# Patient Record
Sex: Female | Born: 1970 | Race: White | Hispanic: No | Marital: Single | Smoking: Former smoker
Health system: Southern US, Community
[De-identification: ages and names within clinical notes are randomized; demographics above are authoritative.]

## PROBLEM LIST (undated history)

## (undated) DIAGNOSIS — K559 Vascular disorder of intestine, unspecified: Secondary | ICD-10-CM

## (undated) DIAGNOSIS — M797 Fibromyalgia: Secondary | ICD-10-CM

## (undated) DIAGNOSIS — K219 Gastro-esophageal reflux disease without esophagitis: Secondary | ICD-10-CM

## (undated) DIAGNOSIS — E559 Vitamin D deficiency, unspecified: Secondary | ICD-10-CM

## (undated) HISTORY — PX: CARPAL TUNNEL RELEASE: SHX101

## (undated) HISTORY — PX: TRIGGER FINGER RELEASE: SHX641

## (undated) HISTORY — PX: ABDOMINAL HYSTERECTOMY: SHX81

## (undated) HISTORY — PX: OTHER SURGICAL HISTORY: SHX169

---

## 2000-01-19 ENCOUNTER — Emergency Department (HOSPITAL_COMMUNITY): Admission: EM | Admit: 2000-01-19 | Discharge: 2000-01-19 | Payer: Self-pay | Admitting: Emergency Medicine

## 2002-08-27 ENCOUNTER — Emergency Department (HOSPITAL_COMMUNITY): Admission: EM | Admit: 2002-08-27 | Discharge: 2002-08-27 | Payer: Self-pay | Admitting: Emergency Medicine

## 2002-08-28 ENCOUNTER — Emergency Department (HOSPITAL_COMMUNITY): Admission: EM | Admit: 2002-08-28 | Discharge: 2002-08-28 | Payer: Self-pay | Admitting: Internal Medicine

## 2004-01-07 ENCOUNTER — Emergency Department (HOSPITAL_COMMUNITY): Admission: EM | Admit: 2004-01-07 | Discharge: 2004-01-07 | Payer: Self-pay | Admitting: Emergency Medicine

## 2004-06-26 ENCOUNTER — Emergency Department: Payer: Self-pay | Admitting: Emergency Medicine

## 2004-09-25 ENCOUNTER — Emergency Department: Payer: Self-pay | Admitting: General Practice

## 2004-11-06 ENCOUNTER — Emergency Department: Payer: Self-pay | Admitting: Internal Medicine

## 2004-11-14 ENCOUNTER — Emergency Department: Payer: Self-pay | Admitting: General Practice

## 2004-11-16 ENCOUNTER — Emergency Department: Payer: Self-pay | Admitting: Emergency Medicine

## 2004-12-10 ENCOUNTER — Emergency Department: Payer: Self-pay | Admitting: Emergency Medicine

## 2004-12-12 ENCOUNTER — Emergency Department: Payer: Self-pay | Admitting: Emergency Medicine

## 2005-01-06 ENCOUNTER — Emergency Department: Payer: Self-pay | Admitting: Emergency Medicine

## 2005-02-05 ENCOUNTER — Emergency Department: Payer: Self-pay | Admitting: Internal Medicine

## 2005-02-12 ENCOUNTER — Ambulatory Visit: Payer: Self-pay | Admitting: Rheumatology

## 2005-02-22 ENCOUNTER — Emergency Department: Payer: Self-pay | Admitting: Emergency Medicine

## 2009-07-30 ENCOUNTER — Emergency Department: Payer: Self-pay | Admitting: Emergency Medicine

## 2009-07-31 ENCOUNTER — Emergency Department: Payer: Self-pay | Admitting: Unknown Physician Specialty

## 2009-10-13 ENCOUNTER — Emergency Department: Payer: Self-pay | Admitting: Emergency Medicine

## 2009-10-27 ENCOUNTER — Emergency Department: Payer: Self-pay | Admitting: Emergency Medicine

## 2009-11-06 ENCOUNTER — Emergency Department: Payer: Self-pay | Admitting: Emergency Medicine

## 2009-12-31 ENCOUNTER — Emergency Department: Payer: Self-pay | Admitting: Internal Medicine

## 2010-04-09 ENCOUNTER — Emergency Department: Payer: Self-pay | Admitting: Internal Medicine

## 2010-04-28 ENCOUNTER — Emergency Department: Payer: Self-pay | Admitting: Unknown Physician Specialty

## 2010-07-03 ENCOUNTER — Emergency Department: Payer: Self-pay | Admitting: Emergency Medicine

## 2010-09-05 ENCOUNTER — Emergency Department: Payer: Self-pay | Admitting: Emergency Medicine

## 2011-01-27 ENCOUNTER — Emergency Department: Payer: Self-pay | Admitting: Emergency Medicine

## 2011-02-28 ENCOUNTER — Emergency Department: Payer: Self-pay | Admitting: Emergency Medicine

## 2011-03-01 ENCOUNTER — Emergency Department: Payer: Self-pay | Admitting: Unknown Physician Specialty

## 2011-06-26 ENCOUNTER — Emergency Department: Payer: Self-pay | Admitting: Emergency Medicine

## 2011-12-01 ENCOUNTER — Emergency Department: Payer: Self-pay | Admitting: Internal Medicine

## 2011-12-08 ENCOUNTER — Emergency Department: Payer: Self-pay | Admitting: Emergency Medicine

## 2011-12-09 ENCOUNTER — Emergency Department: Payer: Self-pay | Admitting: Unknown Physician Specialty

## 2012-02-19 ENCOUNTER — Emergency Department: Payer: Self-pay | Admitting: Emergency Medicine

## 2012-02-19 LAB — COMPREHENSIVE METABOLIC PANEL
Albumin: 3.6 g/dL (ref 3.4–5.0)
Alkaline Phosphatase: 183 U/L — ABNORMAL HIGH (ref 50–136)
Bilirubin,Total: 0.3 mg/dL (ref 0.2–1.0)
Co2: 27 mmol/L (ref 21–32)
Creatinine: 0.66 mg/dL (ref 0.60–1.30)
EGFR (African American): >60
Glucose: 182 mg/dL — ABNORMAL HIGH (ref 65–99)
SGOT(AST): 192 U/L — ABNORMAL HIGH (ref 15–37)
SGPT (ALT): 258 U/L — ABNORMAL HIGH
Sodium: 140 mmol/L (ref 136–145)
Total Protein: 7.2 g/dL (ref 6.4–8.2)

## 2012-02-19 LAB — CBC
MCH: 29.7 pg (ref 26.0–34.0)
Platelet: 259 10*3/uL (ref 150–440)
RDW: 13.8 % (ref 11.5–14.5)

## 2012-02-19 LAB — URINALYSIS, COMPLETE
Bacteria: NONE SEEN
Bilirubin,UR: NEGATIVE
Blood: NEGATIVE
Glucose,UR: NEGATIVE mg/dL (ref 0–75)
Leukocyte Esterase: NEGATIVE
Nitrite: NEGATIVE
Specific Gravity: 1.023 (ref 1.003–1.030)
Squamous Epithelial: 3

## 2012-02-26 ENCOUNTER — Ambulatory Visit: Payer: Self-pay | Admitting: Unknown Physician Specialty

## 2012-02-28 ENCOUNTER — Ambulatory Visit: Payer: Self-pay | Admitting: Unknown Physician Specialty

## 2012-03-05 ENCOUNTER — Ambulatory Visit: Payer: Self-pay | Admitting: Unknown Physician Specialty

## 2012-04-20 ENCOUNTER — Emergency Department: Payer: Self-pay | Admitting: Emergency Medicine

## 2012-06-01 ENCOUNTER — Emergency Department: Payer: Self-pay | Admitting: Emergency Medicine

## 2012-06-01 LAB — LIPASE, BLOOD: Lipase: 102 U/L (ref 73–393)

## 2012-06-01 LAB — CBC
HCT: 40.8 % (ref 35.0–47.0)
HGB: 13.9 g/dL (ref 12.0–16.0)
MCHC: 34.2 g/dL (ref 32.0–36.0)
MCV: 88 fL (ref 80–100)
RDW: 13.8 % (ref 11.5–14.5)

## 2012-06-01 LAB — URINALYSIS, COMPLETE
Bacteria: NONE SEEN
Glucose,UR: NEGATIVE mg/dL (ref 0–75)
Leukocyte Esterase: NEGATIVE
Nitrite: NEGATIVE
Protein: NEGATIVE
RBC,UR: <1 /HPF (ref 0–5)
WBC UR: <1 /HPF (ref 0–5)

## 2012-06-01 LAB — COMPREHENSIVE METABOLIC PANEL
Alkaline Phosphatase: 166 U/L — ABNORMAL HIGH (ref 50–136)
Bilirubin,Total: 0.2 mg/dL (ref 0.2–1.0)
Calcium, Total: 9.2 mg/dL (ref 8.5–10.1)
Chloride: 106 mmol/L (ref 98–107)
Co2: 28 mmol/L (ref 21–32)
EGFR (African American): >60
EGFR (Non-African Amer.): >60
SGPT (ALT): 47 U/L (ref 12–78)

## 2012-06-01 LAB — PREGNANCY, URINE: Pregnancy Test, Urine: NEGATIVE m[IU]/mL

## 2012-06-23 ENCOUNTER — Emergency Department: Payer: Self-pay | Admitting: Internal Medicine

## 2012-07-12 ENCOUNTER — Emergency Department: Payer: Self-pay | Admitting: Emergency Medicine

## 2012-07-12 LAB — URINALYSIS, COMPLETE
Bilirubin,UR: NEGATIVE
Blood: NEGATIVE
Glucose,UR: NEGATIVE mg/dL
Ketone: NEGATIVE
Leukocyte Esterase: NEGATIVE
Nitrite: NEGATIVE
Ph: 8
Protein: NEGATIVE
RBC,UR: 2 /HPF
Specific Gravity: 1.002
Squamous Epithelial: 2
WBC UR: 3 /HPF

## 2012-08-17 ENCOUNTER — Emergency Department: Payer: Self-pay | Admitting: Emergency Medicine

## 2012-10-06 ENCOUNTER — Emergency Department: Payer: Self-pay | Admitting: Emergency Medicine

## 2012-11-01 ENCOUNTER — Emergency Department: Payer: Self-pay | Admitting: Internal Medicine

## 2012-11-01 LAB — COMPREHENSIVE METABOLIC PANEL
Alkaline Phosphatase: 126 U/L (ref 50–136)
Anion Gap: 5 — ABNORMAL LOW (ref 7–16)
BUN: 9 mg/dL (ref 7–18)
EGFR (African American): >60
EGFR (Non-African Amer.): >60
Glucose: 110 mg/dL — ABNORMAL HIGH (ref 65–99)
Total Protein: 7.9 g/dL (ref 6.4–8.2)

## 2012-11-01 LAB — CBC
Platelet: 302 10*3/uL (ref 150–440)
RDW: 13.8 % (ref 11.5–14.5)
WBC: 17.9 10*3/uL — ABNORMAL HIGH (ref 3.6–11.0)

## 2012-11-01 LAB — URINALYSIS, COMPLETE
Bacteria: NONE SEEN
Blood: NEGATIVE
Glucose,UR: NEGATIVE mg/dL (ref 0–75)
Protein: NEGATIVE
RBC,UR: <1 /HPF (ref 0–5)
Squamous Epithelial: 1
WBC UR: 1 /HPF (ref 0–5)

## 2012-12-05 ENCOUNTER — Emergency Department: Payer: Self-pay | Admitting: Unknown Physician Specialty

## 2012-12-05 LAB — CBC
HGB: 12.8 g/dL (ref 12.0–16.0)
MCH: 30.4 pg (ref 26.0–34.0)
MCV: 90 fL (ref 80–100)
RDW: 13.6 % (ref 11.5–14.5)
WBC: 8.2 10*3/uL (ref 3.6–11.0)

## 2012-12-05 LAB — BASIC METABOLIC PANEL
Chloride: 104 mmol/L (ref 98–107)
Co2: 28 mmol/L (ref 21–32)
Creatinine: 0.69 mg/dL (ref 0.60–1.30)
EGFR (African American): >60
Osmolality: 271 (ref 275–301)
Potassium: 3.7 mmol/L (ref 3.5–5.1)

## 2012-12-05 LAB — HEPATIC FUNCTION PANEL A (ARMC)
Albumin: 3.9 g/dL (ref 3.4–5.0)
Alkaline Phosphatase: 126 U/L (ref 50–136)
Bilirubin, Direct: <0.1 mg/dL (ref 0.00–0.20)
SGPT (ALT): 44 U/L (ref 12–78)

## 2012-12-05 LAB — TROPONIN I: Troponin-I: <0.02 ng/mL

## 2012-12-05 LAB — LIPASE, BLOOD: Lipase: 157 U/L (ref 73–393)

## 2012-12-25 ENCOUNTER — Emergency Department: Payer: Self-pay | Admitting: Emergency Medicine

## 2013-01-01 ENCOUNTER — Emergency Department: Payer: Self-pay | Admitting: Internal Medicine

## 2013-02-12 ENCOUNTER — Emergency Department: Payer: Self-pay | Admitting: Emergency Medicine

## 2013-02-22 ENCOUNTER — Inpatient Hospital Stay: Payer: Self-pay | Admitting: Internal Medicine

## 2013-02-22 DIAGNOSIS — K559 Vascular disorder of intestine, unspecified: Secondary | ICD-10-CM | POA: Insufficient documentation

## 2013-02-22 LAB — COMPREHENSIVE METABOLIC PANEL
Alkaline Phosphatase: 125 U/L (ref 50–136)
Anion Gap: 5 — ABNORMAL LOW (ref 7–16)
Calcium, Total: 9.3 mg/dL (ref 8.5–10.1)
Chloride: 102 mmol/L (ref 98–107)
Co2: 29 mmol/L (ref 21–32)
Osmolality: 272 (ref 275–301)
Potassium: 4 mmol/L (ref 3.5–5.1)
SGOT(AST): 23 U/L (ref 15–37)
Total Protein: 8 g/dL (ref 6.4–8.2)

## 2013-02-22 LAB — URINALYSIS, COMPLETE
Bacteria: NONE SEEN
Nitrite: NEGATIVE
Ph: 7 (ref 4.5–8.0)
RBC,UR: <1 /HPF (ref 0–5)
Squamous Epithelial: <1
WBC UR: <1 /HPF (ref 0–5)

## 2013-02-22 LAB — CBC
MCH: 31.1 pg (ref 26.0–34.0)
MCV: 89 fL (ref 80–100)
RDW: 14 % (ref 11.5–14.5)
WBC: 11.7 10*3/uL — ABNORMAL HIGH (ref 3.6–11.0)

## 2013-02-23 LAB — COMPREHENSIVE METABOLIC PANEL
Albumin: 3.3 g/dL — ABNORMAL LOW (ref 3.4–5.0)
Calcium, Total: 8.4 mg/dL — ABNORMAL LOW (ref 8.5–10.1)
EGFR (African American): >60
EGFR (Non-African Amer.): >60
Sodium: 137 mmol/L (ref 136–145)
Total Protein: 6.2 g/dL — ABNORMAL LOW (ref 6.4–8.2)

## 2013-02-23 LAB — CBC WITH DIFFERENTIAL/PLATELET
Eosinophil %: 1.2 %
Lymphocyte %: 19.5 %
Neutrophil %: 72.2 %
RDW: 14 % (ref 11.5–14.5)
WBC: 15.3 10*3/uL — ABNORMAL HIGH (ref 3.6–11.0)

## 2013-02-23 LAB — CLOSTRIDIUM DIFFICILE BY PCR

## 2013-02-24 LAB — BASIC METABOLIC PANEL
Anion Gap: 5 — ABNORMAL LOW (ref 7–16)
Calcium, Total: 8.6 mg/dL (ref 8.5–10.1)
Co2: 29 mmol/L (ref 21–32)
Creatinine: 0.45 mg/dL — ABNORMAL LOW (ref 0.60–1.30)
EGFR (Non-African Amer.): >60
Sodium: 135 mmol/L — ABNORMAL LOW (ref 136–145)

## 2013-02-24 LAB — CBC WITH DIFFERENTIAL/PLATELET
Basophil #: 0 10*3/uL (ref 0.0–0.1)
Eosinophil #: 0.2 10*3/uL (ref 0.0–0.7)
Eosinophil %: 1.3 %
HCT: 34.4 % — ABNORMAL LOW (ref 35.0–47.0)
HGB: 11.6 g/dL — ABNORMAL LOW (ref 12.0–16.0)
Lymphocyte #: 2.6 10*3/uL (ref 1.0–3.6)
Lymphocytes: 31 %
Monocytes: 3 %
Neutrophil #: 10.3 10*3/uL — ABNORMAL HIGH (ref 1.4–6.5)
Neutrophil %: 73.3 %
Platelet: 251 10*3/uL (ref 150–440)
RBC: 3.85 10*6/uL (ref 3.80–5.20)
RDW: 13.7 % (ref 11.5–14.5)
Segmented Neutrophils: 63 %

## 2013-02-24 LAB — SEDIMENTATION RATE: Erythrocyte Sed Rate: 20 mm/hr (ref 0–20)

## 2013-02-24 LAB — STOOL CULTURE

## 2013-02-24 LAB — CLOSTRIDIUM DIFFICILE BY PCR

## 2013-02-25 LAB — CBC WITH DIFFERENTIAL/PLATELET
Basophil #: 0 10*3/uL (ref 0.0–0.1)
Basophil %: 0.1 %
Eosinophil #: 0.1 10*3/uL (ref 0.0–0.7)
HGB: 11.1 g/dL — ABNORMAL LOW (ref 12.0–16.0)
Lymphocyte #: 3 10*3/uL (ref 1.0–3.6)
Lymphocyte %: 22.8 %
MCV: 89 fL (ref 80–100)
Monocyte #: 0.7 x10 3/mm (ref 0.2–0.9)
Monocyte %: 5.4 %
Neutrophil #: 9.3 10*3/uL — ABNORMAL HIGH (ref 1.4–6.5)

## 2013-02-26 LAB — CBC WITH DIFFERENTIAL/PLATELET
Basophil #: 0 10*3/uL (ref 0.0–0.1)
Lymphocyte #: 2.7 10*3/uL (ref 1.0–3.6)
Lymphocyte %: 31.6 %
MCHC: 35 g/dL (ref 32.0–36.0)
Monocyte #: 0.5 x10 3/mm (ref 0.2–0.9)
Monocyte %: 5.6 %
Neutrophil #: 5.1 10*3/uL (ref 1.4–6.5)
Neutrophil %: 60.2 %
Platelet: 297 10*3/uL (ref 150–440)
RBC: 3.53 10*6/uL — ABNORMAL LOW (ref 3.80–5.20)
RDW: 13.4 % (ref 11.5–14.5)

## 2013-02-28 LAB — CULTURE, BLOOD (SINGLE)

## 2013-03-02 LAB — PATHOLOGY REPORT

## 2013-05-11 ENCOUNTER — Emergency Department: Payer: Self-pay | Admitting: Emergency Medicine

## 2013-05-11 LAB — COMPREHENSIVE METABOLIC PANEL
Alkaline Phosphatase: 126 U/L (ref 50–136)
BUN: 7 mg/dL (ref 7–18)
Calcium, Total: 9.5 mg/dL (ref 8.5–10.1)
Co2: 29 mmol/L (ref 21–32)
Creatinine: 0.71 mg/dL (ref 0.60–1.30)
SGOT(AST): 53 U/L — ABNORMAL HIGH (ref 15–37)
SGPT (ALT): 58 U/L (ref 12–78)
Total Protein: 7.9 g/dL (ref 6.4–8.2)

## 2013-05-11 LAB — CBC
HCT: 39 % (ref 35.0–47.0)
HGB: 13.3 g/dL (ref 12.0–16.0)
MCH: 30.9 pg (ref 26.0–34.0)
RBC: 4.31 10*6/uL (ref 3.80–5.20)
WBC: 7.8 10*3/uL (ref 3.6–11.0)

## 2013-05-11 LAB — URINALYSIS, COMPLETE
Bilirubin,UR: NEGATIVE
Glucose,UR: NEGATIVE mg/dL (ref 0–75)
Nitrite: NEGATIVE
Protein: NEGATIVE

## 2013-05-11 LAB — LIPASE, BLOOD: Lipase: 92 U/L (ref 73–393)

## 2013-06-01 DIAGNOSIS — K219 Gastro-esophageal reflux disease without esophagitis: Secondary | ICD-10-CM | POA: Insufficient documentation

## 2013-06-01 DIAGNOSIS — Z87891 Personal history of nicotine dependence: Secondary | ICD-10-CM | POA: Insufficient documentation

## 2013-06-15 ENCOUNTER — Ambulatory Visit: Payer: Self-pay | Admitting: Oncology

## 2013-06-24 ENCOUNTER — Ambulatory Visit: Payer: Self-pay | Admitting: Oncology

## 2013-07-14 ENCOUNTER — Emergency Department: Payer: Self-pay | Admitting: Emergency Medicine

## 2013-08-11 DIAGNOSIS — G4700 Insomnia, unspecified: Secondary | ICD-10-CM | POA: Insufficient documentation

## 2013-08-24 ENCOUNTER — Emergency Department: Payer: Self-pay | Admitting: Emergency Medicine

## 2013-09-03 DIAGNOSIS — E559 Vitamin D deficiency, unspecified: Secondary | ICD-10-CM | POA: Insufficient documentation

## 2015-01-14 NOTE — Consult Note (Signed)
Pt CC is abd pain and colitis.  She can go home today on prednisone 20mg  bid and follow up in office with me on June 16th or June 17th.   Also 4 days of cipro and flagyl is recommended.  Eat a low residue diet. with yogurt 2-3 times a day,.  Electronic Signatures: Scot JunElliott, Juanya Villavicencio T (MD)  (Signed on 08-Jun-14 09:51)  Authored  Last Updated: 08-Jun-14 09:51 by Scot JunElliott, Keishawna Carranza T (MD)

## 2015-01-14 NOTE — Consult Note (Signed)
CC:  rectal bleed and abd pain.  Plan colonoscopy tomorrow afternoon after Golytely prep.  Flex to 25cm did not show any abnormality  Electronic Signatures: Scot JunElliott, Nyal Schachter T (MD)  (Signed on 04-Jun-14 15:56)  Authored  Last Updated: 04-Jun-14 15:56 by Scot JunElliott, Smitty Ackerley T (MD)

## 2015-01-14 NOTE — Consult Note (Signed)
CC: colitis of descending/sigmoid colon.  CT angiogram showed normal wide open vessels.  Will start iv solumedrol and switch to oral prednisone.  Keep on full liquid diet for now.  May go home in 2-3 days,  Work up for hypercoaguable state to be done.  Await bx from colonoscopy yesterday.  Pt exam shows no peritoneal signs, mild tenderness left abd.   Electronic Signatures: Scot JunElliott, Deontrae Drinkard T (MD)  (Signed on 06-Jun-14 16:18)  Authored  Last Updated: 06-Jun-14 16:18 by Scot JunElliott, Emylia Latella T (MD)

## 2015-01-14 NOTE — Consult Note (Signed)
CC: rectal bleed and abd pain.  Her pain and area of involvement on CT suggest ischemic colitis.  Plan to do flex sig tomorrow with sedation to get a look at the inflammed area for a better chance at  diagnosis.  Electronic Signatures: Scot JunElliott, Austyn Perriello T (MD)  (Signed on 03-Jun-14 18:36)  Authored  Last Updated: 03-Jun-14 18:36 by Scot JunElliott, Abdulrahim Siddiqi T (MD)

## 2015-01-14 NOTE — Consult Note (Signed)
Chief Complaint:  Subjective/Chief Complaint Colitis with bloody diarrhea and generalized abdominal pain with rebound tenderness. Negative stool studies. Flex sig  without prep for tomorrow am. Patient states diarrhea is about 10 times today brown with bloody discharge. Less frequent, but pain is still the same. With Dilaudid 1-98m IV q 4 hr prn pain is 5/10 and after  3 hrs it returns to 10/10. Some mild nasuea/no vomiting. Ice chips today. Hurts in abd to walk.   VITAL SIGNS/ANCILLARY NOTES: **Vital Signs.:   03-Jun-14 14:57  Vital Signs Type Routine  Temperature Temperature (F) 99  Celsius 37.2  Temperature Source oral  Pulse Pulse 100  Respirations Respirations 18  Systolic BP Systolic BP 1209 Diastolic BP (mmHg) Diastolic BP (mmHg) 86  Mean BP 99  Pulse Ox % Pulse Ox % 99  Pulse Ox Activity Level  At rest  Oxygen Delivery Room Air/ 21 %   Brief Assessment:  GEN well developed, no acute distress, thin   Cardiac Regular  no murmur   Respiratory normal resp effort  clear BS   Gastrointestinal details normal Soft  Nondistended   Gastrointestinal details abnormal Tender....  Bowel sounds hyperactive   Tenderness diffuse bilat   lower tenderness with rebound tenderness.,   EXTR negative cyanosis/clubbing, negative edema   Lab Results:  Hepatic:  02-Jun-14 04:12   Bilirubin, Total 0.5  Alkaline Phosphatase 90  SGPT (ALT) 23  SGOT (AST) 19  Total Protein, Serum  6.2  Albumin, Serum  3.3  Routine Chem:  02-Jun-14 04:12   Glucose, Serum  107  BUN  4  Creatinine (comp)  0.59  Sodium, Serum 137  Potassium, Serum 4.0  Chloride, Serum 103  Calcium (Total), Serum  8.4  Osmolality (calc) 271  eGFR (African American) >60  eGFR (Non-African American) >60 (eGFR values <672mmin/1.73 m2 may be an indication of chronic kidney disease (CKD). Calculated eGFR is useful in patients with stable renal function. The eGFR calculation will not be reliable in acutely ill  patients when serum creatinine is changing rapidly. It is not useful in  patients on dialysis. The eGFR calculation may not be applicable to patients at the low and high extremes of body sizes, pregnant women, and vegetarians.)  Anion Gap  4  Routine Hem:  02-Jun-14 04:12   WBC (CBC)  15.3  RBC (CBC) 3.95  Hemoglobin (CBC) 12.2  Hematocrit (CBC) 35.1  Platelet Count (CBC) 260  MCV 89  MCH 30.8  MCHC 34.7  RDW 14.0  Neutrophil % 72.2  Lymphocyte % 19.5  Monocyte % 6.9  Eosinophil % 1.2  Basophil % 0.2  Neutrophil #  11.0  Lymphocyte # 3.0  Monocyte #  1.1  Eosinophil # 0.2  Basophil # 0.0 (Result(s) reported on 23 Feb 2013 at 05:03AM.)   Radiology Results: CT:    01-Jun-14 18:44, CT Abdomen and Pelvis With Contrast  CT Abdomen and Pelvis With Contrast   REASON FOR EXAM:    (1) abd pain bleeding; (2) pel pain  COMMENTS:       PROCEDURE: CT  - CT ABDOMEN / PELVIS  W  - Feb 22 2013  6:44PM     RESULT:  Axial CT scanning was performed through the abdomen and pelvis   with reconstructions at 3 mm intervals and slice thicknesses. Review of   multiplanar reconstructed images was performed separately on the VIA   monitor. Comparison is made to study of November 01, 2012.    The liver  exhibits no focal mass nor ductal dilation. The gallbladder,   pancreas, spleen, partially distended stomach, adrenal glands, and   kidneys are normal in appearance. The caliber of the abdominal aorta is   normal. There is no periaortic nor pericaval lymphadenopathy.  The partially contrast-filled loops of small bowel are normal in   appearance. The ascending and transverse portions of the colon are   reasonably normal in appearance. Beginning at approximately the splenic   flexure there is no contrast within the colon, and there is thickening of   the wall of the descending colon and rectosigmoid colon in a fashion   consistent with colitis. There is no free extraluminal gas nor   significant  free fluid demonstrated. A normal calibered gas-filled   appendix is demonstrated. There is no inguinal nor umbilical hernia.    The urinary bladder is partially distended and grossly normal. The uterus   is surgically absent. There are no adnexal masses. The lung bases are   clear. The lumbar vertebral bodies are preserved in height.    IMPRESSION:    1. There is bowelwall thickening of the descending colon and     rectosigmoid consistent with colitis. No significant diverticulosis is   demonstrated. The appearance of the bowel dramatically changed since   February 2014. There is no evidence of an abscess nor freeair. A trace   of free fluid in the pelvis is suspected.  2. There is no acute urinary tract abnormality nor acute hepatobiliary   abnormality.  3. The uterus is surgically absent. No adnexal masses are demonstrated.    A preliminary report was sent to the emergency department at the   conclusion of the study.    Dictation Site: 1      The question of the possibility of the findings in the left colon being     secondary to vascular insufficiency has been raised. While this cannot be   excluded based on the CT findings today, the celiac trunk, the superior   mesenteric trunk, and the inferior mesenteric artery trunk appear normal.   No significant calcified or soft plaque is demonstrated in the abdominal   aorta.        Verified By: DAVID A. Martinique, M.D., MD   Assessment/Plan:  Assessment/Plan:  Assessment Acute bloody diarrhea with generalized abd pain and rebound tenderness. She is now on Meropenem since there is a Flagyl shortage.  Slightly less frequent diarrhea:  Patient reports at least 10 movements some with brown stool, others just blood since this am. Positive nocturnal diarrhea. Etiology r/o NSAID induced, bacterial-neg cultures so far, emerging IBD.   Plan Flat/upright abd XR in am  Surgical consult  cbc w diff, esr, crp and met b now. Repeat stool for C  diff CBC in am.  Flex sig without prep for am  Case d/w Dr. Vira Agar in collaboration of care.   Electronic Signatures: Gershon Mussel (NP)  (Signed 03-Jun-14 15:50)  Authored: Chief Complaint, VITAL SIGNS/ANCILLARY NOTES, Brief Assessment, Lab Results, Radiology Results, Assessment/Plan   Last Updated: 03-Jun-14 15:50 by Gershon Mussel (NP)

## 2015-01-14 NOTE — Consult Note (Signed)
CC: abd pain and rectal bleeding.  Pt had colonoscopy showing inflammation and scattered ulcerations of descending colon only.  Unusual distribution and raises the question of ischemic colitis and possible Crohn;s disease, possible infection.  Bx done, will get hematology consult for evaluation of possible hypercoag state, get Ct angiogram tomorrow and if neg consider vascular angiogram.  Although Crohn's can occur anywhere in GI tract the presentation usually more isidious than hers.  Electronic Signatures: Scot JunElliott, Robert T (MD)  (Signed on 05-Jun-14 17:48)  Authored  Last Updated: 05-Jun-14 17:48 by Scot JunElliott, Robert T (MD)

## 2015-01-14 NOTE — Discharge Summary (Signed)
PATIENT NAME:  [name], [name] DATE OF BIRTH:  June 26, 1971  DATE OF ADMISSION:  02/22/2013 DATE OF DISCHARGE:  03/01/2013  ADMISSION DIAGNOSIS:  Bright red blood per rectum.   DISCHARGE DIAGNOSES 1.  Bright red blood per rectum due to acute colitis. It could be ischemic or inflammatory bowel disease. Currently pathology is pending. Her bright red blood per rectum is resolved.  2.  Leukocytosis due to #1. 3.  Gastroesophageal reflux disease.  4.  Acute blood loss anemia due to gastrointestinal blood loss.  5.  Elevated C-reactive protein of unclear etiology.   CONSULTANTS 1.  Dr. Mechele CollinElliott.  2.  Surgery, Dr. Egbert GaribaldiBird.   PERTINENT LABS AND EVALUATIONS: Urinalysis showed nitrites negative, leukocytes negative. Lipase 74. WBC 11.7, hemoglobin 14.4, platelet count 322. BMP: Glucose 115, BUN 10, creatinine 0.64, sodium 136, potassium 4.0, chloride was 102, CO2 is 29, calcium 9.3. LFTs were normal. Blood cultures x 2 no growth. Stool cultures no salmonella, shigella or E. coli  noted. C. diff. was negative. CT of the abdomen and pelvis showed bowel wall thickening of the descending colon and rectosigmoid consistent with colitis. No significant diverticulosis appreciated. Colonoscopy showed ulcerations in the colon. CRP was 91.6. Sed rate was 20. CT angiogram showed widely patent vasculature, mesenteric vessels were widely patent, no evidence of ischemic colitis, evidence of colitis was present. Delayed colonoscopy and flexible sigmoidoscopy showed internal hemorrhoids. Colonoscopy showed mucosal ulcerations.   HOSPITAL COURSE: Please refer to H and P done by the admitting physician. The patient is a 44 year old female who presented with bright red blood per rectum. She was seen in the ED and had a CT scan which showed findings consistent with colitis. The patient was started on IV antibiotics for possible infectious colitis. She was treated with IV meropenem initially. Stool cultures were  negative for C. diff. as well as other bacterial organisms. The patient was seen by GI. She underwent a sigmoidoscopy which showed some internal hemorrhoids. Subsequently, she had a colonoscopy which showed patchy mucosal ulceration. Differential diagnosis included ischemic colitis or inflammatory bowel disease. The patient was started on prednisone. She had a CTA of the abdomen which showed no evidence of occlusion of the vasculature. She also was seen by surgery. They did not feel any surgical intervention that needed to be done. The patient's pathology results from the biopsy of the colon are currently pending but she is tolerating diet and is doing much better and is stable for discharge. She also had leukocytosis which was felt to be due to her colitis, which has since resolved. Currently, she is doing much better and is cleared by GI for discharge.   DISCHARGE MEDICATIONS: Prilosec 40 daily, amitriptyline 10 mg 1 to 2 tabs at bedtime as needed, Flagyl 500 one tabs p.o. q.8 x 4 days, acetaminophen/hydrocodone 5/325 q.4 p.r.n., Cipro 500 one tabs p.o. q.12 x 4 days, prednisone 20 one tab p.o. b.i.d.   DIET: Low fat, low cholesterol.   ACTIVITY: As tolerated.   FOLLOWUP: Follow up with Dr. Mechele CollinElliott on June 16 or 17 as per his direction.   TIME SPENT: 35 minutes spent on the discharge.    ____________________________ Jennifer ScottsShreyang H. Allena KatzPatel, MD shp:cs D: 03/01/2013 12:45:42 ET T: 03/01/2013 15:08:48 ET JOB#: [id number]  cc: Jennifer Harral H. Allena KatzPatel, MD, <Dictator> Charise CarwinSHREYANG H Nilsa Macht MD ELECTRONICALLY SIGNED 03/07/2013 15:21

## 2015-01-14 NOTE — Consult Note (Signed)
PATIENT NAME:  [name], [name] MR#:  [id number] DATE OF BIRTH:  1971/01/12  DATE OF CONSULTATION:  02/24/2013  CONSULTING PHYSICIAN:  Loraine LericheMark A. Egbert GaribaldiBird, MD  REASON FOR CONSULTATION: I was asked to see this patient by the Medical Service for evaluation of abdominal pain and left-sided colitis.   HISTORY: This is a 44 year old white female admitted to the Medical Service on the 1st of June with a 1-day history of significant abdominal pain and gross bright red blood per rectum. She has never had this before. She has a history of gastroesophageal reflux disease. She has had some intermittent chronic right upper quadrant abdominal pain which has been worked up in the past and not found to be significant for biliary disease. The patient has a remote cigarette smoking history. She does not drink, does not do drugs.  She is currently employed, has 1 child, aged 44. Only other previous operations were bilateral oophorectomy and salpingectomy. The patient states that she has had lessening of bloody diarrhea but continued abdominal pain while in the hospital. She has been seen by GI. A flexible sigmoidoscopy is scheduled for tomorrow. Imaging study demonstrated findings consistent with diffuse left-sided and rectosigmoid colitis. The remaining intra-abdominal organs appeared unremarkable. Surgical Services were asked to comment.   ALLERGIES: INCLUDE KEFLEX AND SULFA.   CURRENT MEDICATIONS: Include: 1.  Meropenem.  2.  Protonix. 3.  Acetaminophen.  4.  Norco.  5.  Dilaudid. 6.  Zofran.   PAST MEDICAL HISTORY: Significant for gastroesophageal reflux disease.   PAST SURGICAL HISTORY:  As described above.   SOCIAL HISTORY: As described above.   FAMILY HISTORY: Noncontributory.  REVIEW OF SYSTEMS: The patient denies fevers, jaundice, vomiting.  Significant for abdominal pain and bloody diarrhea as described above.   PHYSICAL EXAMINATION: GENERAL: The patient is alert and oriented, just got out of the  shower.  VITAL SIGNS: Temperature is 99.1, pulse is 100, respiratory rate 18, blood pressure is 127/86.  LUNGS: Clear.  HEART: Regular rate and rhythm.  ABDOMEN: Mildly distended, well-healed scars. No hernia. No mass.  There is no peritonitis  or significant tenderness in the left lower quadrant as well as in the right lower quadrant.  There is a positive Rovsing's sign on the right side to the left side.  EXTREMITIES: Warm and well perfused.  NEUROLOGICAL/PSYCHIATRIC:  Normal.    LABORATORY AND RADIOLOGICAL DATA:  White count is 14,000. Hemoglobin is 11.6.  Sed rate is 20, platelet count 251,000. Liver function tests are normal. Electrolytes are unremarkable.  Review of CT scan is as described above.   IMPRESSION: A 44 year old white female with abdominal pain and colitis. Differential diagnosis includes ischemic versus infectious versus inflammatory bowel disease.   PLAN: I agree with meropenem, planned luminal evaluation tommorow.  At present,  I see no indication for surgical intervention.   Will follow clinical course with you.  ____________________________ Redge GainerMark A. Egbert GaribaldiBird, MD FACS mab:cb D: 02/24/2013 21:23:37 ET T: 02/24/2013 22:04:55 ET JOB#: [id number]  cc: Loraine LericheMark A. Egbert GaribaldiBird, MD, <Dictator> Raynald KempMARK A Drianna Chandran MD ELECTRONICALLY SIGNED 02/24/2013 22:44

## 2015-01-14 NOTE — Consult Note (Signed)
Dr. Bluford Kaufmannh progress note is on the wrong patient named "Yetta BarreJones" please disregard this note. seen by me and NP Fransico SettersKim Mills.  She has  abd tenderness, abnormal CT scan, 1176f descending and recto sigmoid colon which is either ischemia or food poisoning or possible reaction to NSAID.  Infection and possible ischemia are the most likely.  Will follow, on antibiotics. Could have large or small vessel disease.  Plan Colonoscopy With no prep Wednesday.  Electronic Signatures: Scot JunElliott, Robert T (MD)  (Signed on 02-Jun-14 20:11)  Authored  Last Updated: 02-Jun-14 20:11 by Scot JunElliott, Robert T (MD)

## 2015-01-14 NOTE — Consult Note (Signed)
PATIENT NAME:  [name], [name] MR#:  [id number] DATE OF BIRTH:  [birth year]  DATE OF CONSULTATION:  02/23/2013  REFERRING PHYSICIAN:  Adrian Saran, MD CONSULTING PHYSICIAN:  Lynnae Prude, MD / Ranae Plumber. Arvilla Market, ANP (Adult Nurse Practitioner)  REASON FOR CONSULTATION: Rectal bleed with possible colitis.   HISTORY OF PRESENT ILLNESS: This 44 year old patient of Dr. Allena Katz at [facility] has past medical history of perforated peptic ulcer disease at age 28 and hypertension. She has had problems with dental caries and had finished amoxicillin, 10 day course, as well as ibuprofen 800 mg 1 a day for about 2 weeks. The patient reports Thursday morning, 02/19/2013, she awoke at 0500 with vomiting. Stomach did not feel well all day long, but nothing specific. She rested, ate toast, had no further emesis episodes. On Friday she still felt a little off but Saturday she was able to eat a grilled chicken sandwich from [employer]. She later developed discomfort, right upper quadrant, described as a knot that radiated into the right back, and was severe enough that she had trouble sleeping. She did take ibuprofen for this discomfort. She awoke at 0430 with hurting in the lower abdomen, sense that she had to have a bowel movement, and while sitting on the toilet developed nausea, sweatiness and passed a large loose bowel movement. When she went back to bed she immediately had to get up and passed a second movement and this was of more liquid with blood in it. After that she had bloody diarrhea every 15 minutes and continued to have bilateral lower abdominal pain radiating to the back associated with chills.   She presented to the Emergency Room with this history and underwent a CT study of the abdomen and pelvis with contrast that showed bowel wall thickening of the descending colon and rectosigmoid colon consistent with colitis. Stool studies were negative for comprehensive culture, C. difficile PCR, blood  cultures negative and urinalysis unremarkable. Mild leukocytosis.  The patient was given a dose of Zosyn, presents now on Cipro and Flagyl. She states her diarrhea may be a little less, but she still has considerable abdominal pain requiring pain medication.   The patient denies any prior history of abdominal pain or bloody diarrhea. She has had some problems with epigastric discomfort and right upper quadrant and underwent upper endoscopy last year for investigation. A CT study last year showed mild fatty infiltration of the liver, oral contrast retained in the stomach and no evidence of abnormal gallbladder. Abdominal ultrasound and HIDA study were negative. She thinks she had a remote colonoscopy years ago and cannot give details.   PAST MEDICAL HISTORY: 1.  Perforated peptic ulcer, age 24.  2.  Hypertension.  3.  Acid reflux.  4.  Minimal active chronic gastritis, negative H. pylori.  5.  Fatty infiltration of the liver, per CT study.  6.  History of elevation in liver enzymes with negative ANA, negative hepatitis viral panel, normal pro time, platelet count, ferritin, ceruloplasmin, active smooth muscle antibody and mitochondrial antibody.   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 Remote cocaine use. Negative history of IV drug use.   SOCIAL HISTORY: The patient is single, has a 73 year old son, recently unemployed.   FAMILY HISTORY: Maternal grandmother with colon cancer in her 77s. Son with asthma. Paternal grandmother with lung cancer in her 22s. There is positive breast cancer. The patient and mother deny history of colon  polyps in mother.   REVIEW OF SYSTEMS:  Ten systems reviewed and are negative, with the exception as noted in history of present illness for chills, vomiting, diarrhea, chronic intermittent  right upper quadrant discomfort and epigastric discomfort with findings of possible gastroparesis, chronic  active gastritis per EGD, fatty liver on CT study. Positive situational stress with recent job loss. She reports normal weight, diet and appetite. Remaining systems negative.   PHYSICAL EXAMINATION: VITAL SIGNS: 98.2, 101, 18, 125/84, 98% room air.  GENERAL: Well nourished, healthy-appearing, middle-aged Caucasian female INAD.  HEENT: Head is normocephalic. Conjunctivae is pink. Sclerae is anicteric. Oral mucosa is moist and intact.  HEART: Heart tones S1 and S2 without murmur or gallop.  LUNGS:  CTA. Respirations are eupneic.  ABDOMEN: Soft, positive hyperactive bowel sounds, positive tenderness, generalized, left and right lower quadrant. Very tender with palpation and percussion. She has pain that hurts more with cough.  RECTAL: Deferred.  SKIN: Warm and dry without rash or edema.  PSYCH: Affect and mood within normal, cooperative, pleasant.  NEUROLOGICAL: Cranial nerves II through XII grossly intact.  LABORATORY AND DIAGNOSTICS: Admission labs with WBC 11.7, now 15.3. Hemoglobin 14.4, now 12.2. Neutrophil is elevated at 11 today. Blood culture negative x 2. Stool culture negative. C. diff PCR negative. Urinalysis is normal.   Radiology:  CT scan of the abdomen and pelvis showed liver without focal mass or ductal dilatation. Partially contrast loops of small bowel are normal. Ascending transverse portion of the colon are normal. Beginning approximately at the splenic flexure there is no contrast within the colon and there is thickening of the wall of the descending colon and rectosigmoid colon in a fashion consistent with colitis. There is no free extraluminal gas or significant free fluid demonstrated. A normal calibrated gas-filled appendix is demonstrated. No inguinal or umbilical hernia. The uterus is surgically absent. No adnexal masses are demonstrated.   IMPRESSION: The patient presents with acute episode of bloody diarrhea, significant abdominal pain and an abnormal CT study suggesting  left-sided colitis. The patient has exposure to amoxicillin and ibuprofen. C. difficile PCR and comprehensive stool studies are negative. She presents with leukocytosis and is on current antibiotic therapy. Frequency of diarrhea is slightly decreased with this hospitalization, but not yet back to normal. She has considerable abdominal tenderness, left side greater than right, but is bilateral. Etiology to include bacterial diarrhea with chicken dinner, ischemic colitis, NSAID induced colitis or new onset inflammatory bowel disease.   PLAN: This case was discussed with Dr. Mechele CollinElliott in collaboration of care and he does recommend continuing with Cipro 400 mg IV q. 12 hours and increasing the Flagyl to 500 mg IV q. 6 hours.  I will ask radiology to comment on the vascular portion of the contrasted CT study. Continue with hydration, serial labs, pain medication and GI to follow closely with you.   Thank you for the consultation.   These services are provided by Cala BradfordKimberly A. Arvilla MarketMills, RN, MS, APRN, [facility], ANP under collaborative agreement with Scot Junobert T. Elliott, MD  ____________________________ Ranae PlumberKimberly A. Arvilla MarketMills, ANP (Adult Nurse Practitioner) kam:sb D: 02/23/2013 14:27:27 ET T: 02/23/2013 15:04:46 ET JOB#: [id number]  cc: Cala BradfordKimberly A. Arvilla MarketMills, ANP (Adult Nurse Practitioner), <Dictator> Ranae PlumberKimberly A. Suzette BattiestMills RN, MSN, ANP-BC Adult Nurse Practitioner ELECTRONICALLY SIGNED 02/24/2013 8:07

## 2015-01-14 NOTE — Consult Note (Signed)
Pt CC is colitis of descending colon, possible Crohn's, possible small vessel ischemic colitis, hematology work up in progress.  Pt improves each day.  Could change to oral meds, ambulate and home tomorrow and follow up with me as out pt. Hgb 12, TSH nl, blood cult neg.  Await bx.  Can change to oral prednisone at 40mg  a day and stop iv antibioitics.  Give 4-5 days of Cipro and flagyl oral.  Abd exam less tender, no masses, abd flat.  Electronic Signatures: Scot JunElliott, Robert T (MD)  (Signed on 07-Jun-14 11:04)  Authored  Last Updated: 07-Jun-14 11:04 by Scot JunElliott, Robert T (MD)

## 2015-01-14 NOTE — H&P (Signed)
PATIENT NAME:  [name], [name] MR#:  [id number] DATE OF BIRTH:  May 09, 1971  DATE OF ADMISSION:  02/22/2013  PRIMARY CARE PHYSICIAN:  Dr. Denton Lank  CHIEF COMPLAINT: Bright red blood per rectum.  HISTORY OF PRESENT ILLNESS:  A 44 year old female with history of GERD, who presents with the above complaint. Since 4:00 this morning she has had gross blood in her bowel movements, and now all she is doing is passing blood clots through her rectum. This also is  associated with cramping abdominal pain. She says it feels worse than labor pains. Actually, the abdominal pain gets worse with every bowel movement. She does have a remote history of diverticulosis. She had a recent tooth infection and she was taking some ibuprofen, but she said she only took a couple of doses.    REVIEW OF SYSTEMS: CONSTITUTIONAL:  No fever. She has fatigue and weakness.   EYES:  No blurred or double vision, glaucoma, cataracts. EARS, NOSE, THROAT:  No ear pain, hearing loss, seasonal allergies, postnasal drip, sinus pain.  RESPIRATORY:  No cough, wheezing, hemoptysis, dyspnea.  CARDIOVASCULAR:  No chest pain, orthopnea, edema, arrhythmia, dyspnea on exertion.  GASTROINTESTINAL: No nausea or vomiting. Positive abdominal pain. Positive rectal bleeding. Positive GERD.  GENITOURINARY:  No dysuria or hematuria. ENDOCRINE:  No polyuria, polydipsia, thyroid problems.  HEMATOLOGIC/LYMPHATIC:  No anemia or easy bruising. SKIN:  No rash or lesions.  MUSCULOSKELETAL:  No limited activity. No arthritis.  NEUROLOGIC:  No history of CVA, TIA, seizures.  PSYCHIATRIC:  No history of anxiety or depression.   PAST MEDICAL HISTORY:  GERD.   MEDICATIONS:  Prilosec OTC.   FAMILY HISTORY:  Positive for hypertension.   SOCIAL HISTORY:  No tobacco, alcohol or drug use.   SURGICAL HISTORY:   Hysterectomy and oophorectomy.   PHYSICAL EXAMINATION: VITAL SIGNS:  Temperature 98.1, pulse 80, respirations 18, blood pressure 151/97, 100% on  room air.  GENERAL:  The patient is alert and oriented, is in some distress, basically due to her abdominal pain.  HEENT:  Head is atraumatic. Pupils are round. Sclerae anicteric. Mucous membranes are dry.  Oropharynx is clear.  NECK:  Supple. No JVD, carotid bruit, enlarged thyroid.  CARDIOVASCULAR:  Regular rate and rhythm. No murmurs, gallops or rubs. PMI is not displaced.  LUNGS:  Clear to auscultation, without crackles, rales, rhonchi or wheezing. Normal percussion. Symmetrical rise of the chest.  ABDOMEN: Bowel sounds are positive. She has tenderness diffusely, but no rebound or guarding. No hepatosplenomegaly.   MUSCULOSKELETAL: No pathology to digits or nails. Extremities move x 4. No tenderness or effusion.  SKIN:  Without rash or lesions.  BACK:  No CVA or vertebral tenderness.  NEUROLOGIC:  Cranial nerves II through XII are grossly intact. No focal deficits.  EXTREMITIES:  No clubbing, cyanosis, edema.   LABORATORIES:   Urinalysis shows 1+ blood, negative LCE, white blood cells. Total protein 8.0, albumin 4.4, bilirubin 0.2, alk phos 125, AST 23, ALT 35. Sodium 136, potassium 4.0, chloride 102, bicarb 29, BUN 10, creatinine 0.64, glucose 115, calcium is 9.3. CT of the abdomen and pelvis shows nonspecific colitis, sigmoid and left colon.   ASSESSMENT AND PLAN:  A 44 year old female who presents with bright red blood per rectum, found to have colitis of the sigmoid and left colon.   1.  Bright red blood per rectum secondary to colitis, less likely ischemic in nature. Will obtain a GI consultation. I started Cipro and Flagyl. Stool cultures and Clostridium difficile cultures  have been ordered in the Emergency Room. She also has abdominal pain associated with this, so we will treat with pain medications p.r.n.   2.  Colitis, as mentioned above. Will treat with Cipro and Flagyl. Stool cultures and C. diff. GI consultation for possible flex sig.   3.  Elevated glucose. Will repeat a BMP in  the morning, and monitor glucose levels. If it is still elevated, we can consider as an outpatient further work-up.   4.  The patient is a FULL CODE STATUS.    Time spent:  Approximately 40 minutes.    ____________________________ Donell Beers. Benjie Karvonen, MD spm:mr D: 02/22/2013 20:24:26 ET T: 02/22/2013 20:52:23 ET JOB#: [id number]  cc: Elam Ellis P. Benjie Karvonen, MD, <Dictator> Sarah "Baltazar Apo, MD  Donell Beers Shane Melby MD ELECTRONICALLY SIGNED 02/22/2013 22:35

## 2015-01-14 NOTE — Consult Note (Signed)
Brief Consult Note: Diagnosis: Ischemic colitis/Crohns.   Recommend further assessment or treatment.   Discussed with Attending MD.   Comments: Patient with Gi Bleed with colitis- awaiting pathology Discussed with Dr Markham JordanElliot. Possibility of mesenteric ischemia. Awaiting angiogram Thrombophilia panel ordered. Will follow.  Electronic Signatures: [name], [name] (MD)  (Signed 06-Jun-14 15:17)  Authored: Brief Consult Note   Last Updated: 06-Jun-14 15:17 by [name], Namiko Pritts Kennedy (MD)

## 2015-01-14 NOTE — Consult Note (Signed)
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 PD cannulated but deep cannulation not possible. CBD could not be cannulated despite numerous attempts. Elected to stop. Keep NPO except for meds/ice chips. Watch for pancreatitis. Needs interventional radiology to do PTC and drain. If not successful, will need to go to tertiary care for drainage. Dr. Vira Agar to see patient tomorrow.Thanks  Electronic Signatures: Verdie Shire (MD)  (Signed on 02-Jun-14 15:42)  Authored  Last Updated: 02-Jun-14 15:42 by Verdie Shire (MD)

## 2015-01-14 NOTE — Consult Note (Signed)
Brief Consult Note: Diagnosis: colitis, ischemic vs infectious, vs IBD.   Patient was seen by consultant.   Consult note dictated.   Recommend further assessment or treatment.   Comments: At present no surgical indication for bleeding perforation or obstruction, awaiting endoscopy findings will be of benefit to triage further work-up and direct therapy.  Electronic Signatures: Natale LayBird, Kairah Leoni (MD)  (Signed 03-Jun-14 20:38)  Authored: Brief Consult Note   Last Updated: 03-Jun-14 20:38 by Natale LayBird, Yarden Hillis (MD)

## 2015-04-06 ENCOUNTER — Encounter: Payer: Self-pay | Admitting: Emergency Medicine

## 2015-04-06 ENCOUNTER — Emergency Department
Admission: EM | Admit: 2015-04-06 | Discharge: 2015-04-06 | Disposition: A | Discharge status: Home / Self Care | Payer: No Typology Code available for payment source | Attending: Emergency Medicine | Admitting: Emergency Medicine

## 2015-04-06 DIAGNOSIS — R21 Rash and other nonspecific skin eruption: Secondary | ICD-10-CM | POA: Insufficient documentation

## 2015-04-06 HISTORY — DX: Gastro-esophageal reflux disease without esophagitis: K21.9

## 2015-04-06 MED ORDER — METHYLPREDNISOLONE SODIUM SUCC 125 MG IJ SOLR
80.0000 mg | Freq: Once | INTRAMUSCULAR | Status: AC
  Administered 2015-04-06: 80 mg via INTRAMUSCULAR
  Filled 2015-04-06: qty 2

## 2015-04-06 NOTE — ED Notes (Signed)
C/o rash with itching

## 2015-04-06 NOTE — ED Provider Notes (Signed)
[facility] Emergency Department Provider Note  ____________________________________________  Time seen:1300  I have reviewed the triage vital signs and the nursing notes.   HISTORY  Chief Complaint Rash   HPI [name] is a 44 y.o. female is here with complaint of rash for several weeks. 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  None of her coworkers had this rash and no one in her family has this as well. She states she has had this in the past and has gotten better. Recently it started coming back and she began taking a pack of prednisone and that long-term mother. After the 6 days of taking prednisone areas were drying up but more areas were appearing. She states that she has seen her PCP in the past and was told that it was psoriasis. She has not followed up with anyone since. Currently she complains of itching especially when getting hot. She denies any fever or chills. Rash is located on the upper and lower extremities. She denies having any on the trunk.Negative for tick bites   Past Medical History  Diagnosis Date  . GERD (gastroesophageal reflux disease)     There are no active problems to display for this patient.   History reviewed. No pertinent past surgical history.  No current outpatient prescriptions on file.  Allergies Sulfa antibiotics  History reviewed. No pertinent family history.  Social History History  Substance Use Topics  . Smoking status: Never Smoker   . Smokeless tobacco: Not on file  . Alcohol Use: No    Review of Systems Constitutional: No fever/chills Eyes: No visual changes. ENT: No sore throat. Cardiovascular: Denies chest pain. Respiratory: Denies shortness of breath. Gastrointestinal: No abdominal pain.  No nausea, no vomiting.  Genitourinary: Negative for dysuria. Musculoskeletal: Negative for back pain. Skin: Positive for rash. Neurological: Negative for headaches  10-point ROS  otherwise negative.  ____________________________________________   PHYSICAL EXAM:  VITAL SIGNS: ED Triage Vitals  Enc Vitals Group     BP 04/06/15 1209 155/89 mmHg     Pulse Rate 04/06/15 1209 86     Resp 04/06/15 1209 20     Temp 04/06/15 1209 98.5 F (36.9 C)     Temp Source 04/06/15 1209 Oral     SpO2 04/06/15 1209 98 %     Weight 04/06/15 1209 158 lb (71.668 kg)     Height 04/06/15 1209  (1.549 m)     Head Cir --      Peak Flow --      Pain Score 04/06/15 1210 0     Pain Loc --      Pain Edu? --      Excl. in GC? --     Constitutional: Alert and oriented. Well appearing and in no acute distress. Eyes: Conjunctivae are normal. PERRL. EOMI. Head: Atraumatic. Nose: No congestion/rhinnorhea. Neck: No stridor. Cardiovascular: Normal rate, regular rhythm. Grossly normal heart sounds.  Good peripheral circulation. Respiratory: Normal respiratory effort.  No retractions. Lungs CTAB. Gastrointestinal: Soft and nontender. No distention.. Musculoskeletal: No lower extremity tenderness nor edema.  No joint effusions. Neurologic:  Normal speech and language. No gross focal neurologic deficits are appreciated. No gait instability. Skin:  Skin is warm, dry and intact. Erythematous, macular, papular lesions on the lower extremity and forearms. There is no lesions noted on the back or chest and abdomen area. There is no specific pattern. No lesions were noted in the web spaces of the  hands. Some areas appear dry and cracked. There is noted drainage from these areas. Psychiatric: Mood and affect are normal. Speech and behavior are normal.  ____________________________________________   LABS (all labs ordered are listed, but only abnormal results are displayed)  Labs Reviewed - No data to display ____________________________________________  PROCEDURES  Procedure(s) performed: None  Critical Care performed: No  ____________________________________________   INITIAL  IMPRESSION / ASSESSMENT AND PLAN / ED COURSE  Pertinent labs & imaging results that were available during my care of the patient were reviewed by me and considered in my medical decision making (see chart for details).  Patient is to follow-up with [facility]. She was given a injection of Solu-Medrol. She is continue using the creams that she got from her PCP. Her rash is suggestive of eczema. Etiology is undetermined ____________________________________________   FINAL CLINICAL IMPRESSION(S) / ED DIAGNOSES  Final diagnoses:  Rash, skin      Tommi RumpsRhonda L Summers, PA-C 04/06/15 1629  Sharyn CreamerMark Quale, MD 04/11/15 [phone]

## 2015-04-06 NOTE — ED Notes (Signed)
States she developed rash couple of weeks ago. Finished prednisone and then rash is now worse

## 2015-05-16 ENCOUNTER — Encounter: Payer: Self-pay | Admitting: Emergency Medicine

## 2015-05-16 ENCOUNTER — Emergency Department
Admission: EM | Admit: 2015-05-16 | Discharge: 2015-05-16 | Disposition: A | Discharge status: Home / Self Care | Payer: No Typology Code available for payment source | Attending: Emergency Medicine | Admitting: Emergency Medicine

## 2015-05-16 DIAGNOSIS — Z79899 Other long term (current) drug therapy: Secondary | ICD-10-CM | POA: Insufficient documentation

## 2015-05-16 DIAGNOSIS — R21 Rash and other nonspecific skin eruption: Secondary | ICD-10-CM | POA: Diagnosis present

## 2015-05-16 DIAGNOSIS — L259 Unspecified contact dermatitis, unspecified cause: Secondary | ICD-10-CM | POA: Diagnosis not present

## 2015-05-16 HISTORY — DX: Vascular disorder of intestine, unspecified: K55.9

## 2015-05-16 MED ORDER — PREDNISONE 50 MG PO TABS: 50.0000 mg | ORAL_TABLET | Freq: Every day | ORAL | Status: AC

## 2015-05-16 NOTE — ED Provider Notes (Signed)
[facility] Emergency Department Provider Note  ____________________________________________  Time seen: On arrival  I have reviewed the triage vital signs and the nursing notes.   HISTORY  Chief Complaint Rash    HPI [name] is a 44 y.o. female who presents with a rash which she has had for several months. She reports she has a dermatology appointment in November. Complains of itching. The rash is primarily on her forearms and her lower extremities. No fevers no chills. The rash is not painful. She is not sure what is caused it    Past Medical History  Diagnosis Date  . GERD (gastroesophageal reflux disease)   . Colitis, ischemic     There are no active problems to display for this patient.   Past Surgical History  Procedure Laterality Date  . Abdominal hysterectomy    . Carpal tunnel release      Current Outpatient Rx  Name  Route  Sig  Dispense  Refill  . citalopram (CELEXA) 40 MG tablet   Oral   Take 40 mg by mouth daily.         Marland Kitchen omeprazole (PRILOSEC) 20 MG capsule   Oral   Take 20 mg by mouth daily.         . predniSONE (DELTASONE) 50 MG tablet   Oral   Take 1 tablet (50 mg total) by mouth daily with breakfast.   5 tablet   0     Allergies Sulfa antibiotics  No family history on file.  Social History Social History  Substance Use Topics  . Smoking status: Never Smoker   . Smokeless tobacco: None  . Alcohol Use: No    Review of Systems  Constitutional: Negative for fever. Eyes: Negative for visual changes. ENT: Negative for sore throat   Genitourinary: Negative for dysuria. Musculoskeletal: Negative for joint pain Skin: Positive for rash and pruritus Neurological: Negative for headaches    ____________________________________________   PHYSICAL EXAM:  VITAL SIGNS: ED Triage Vitals  Enc Vitals Group     BP 05/16/15 1116 161/97 mmHg     Pulse Rate 05/16/15 1116 89     Resp 05/16/15 1116 18      Temp 05/16/15 1116 98.5 F (36.9 C)     Temp Source 05/16/15 1116 Oral     SpO2 05/16/15 1116 100 %     Weight 05/16/15 1111 158 lb (71.668 kg)     Height 05/16/15 1111  (1.549 m)     Head Cir --      Peak Flow --      Pain Score 05/16/15 1112 7     Pain Loc --      Pain Edu? --      Excl. in GC? --     Constitutional: Alert and oriented. Well appearing and in no distress. Eyes: Conjunctivae are normal.  ENT   Head: Normocephalic and atraumatic.   Mouth/Throat: Mucous membranes are moist. Cardiovascular: Normal rate, regular rhythm.  Respiratory: Normal respiratory effort without tachypnea nor retractions.  Gastrointestinal: Soft and non-tender in all quadrants. No distention. There is no CVA tenderness. Musculoskeletal: Nontender with normal range of motion in all extremities. Neurologic:  Normal speech and language. No gross focal neurologic deficits are appreciated. Skin:  Skin is warm, dry. Patient with rash that looks to be contact dermatitis along the forearms and hands bilaterally as well as the lateral portions of the lower extremities. No evidence of superinfection. Psychiatric: Mood and affect  are normal. Patient exhibits appropriate insight and judgment.  ____________________________________________    LABS (pertinent positives/negatives)  Labs Reviewed - No data to display  ____________________________________________     ____________________________________________    RADIOLOGY I have personally reviewed any xrays that were ordered on this patient: None  ____________________________________________   PROCEDURES  Procedure(s) performed: none   ____________________________________________   INITIAL IMPRESSION / ASSESSMENT AND PLAN / ED COURSE  Pertinent labs & imaging results that were available during my care of the patient were reviewed by me and considered in my medical decision making (see chart for details).  Rash is most  consistent with contact dermatitis. We will prescribe a burst course of steroid's. Encourage the patient to move forward her dermatology appointment  ____________________________________________   FINAL CLINICAL IMPRESSION(S) / ED DIAGNOSES  Final diagnoses:  Contact dermatitis     Jene Every, MD 05/16/15 [phone]

## 2015-05-16 NOTE — ED Notes (Signed)
Pt has red, raised rash all over her body, states she has been treated multiple times for it, dermatologist appoint is not until Nov, she states she cannot wait that long, states it itches.

## 2015-05-16 NOTE — Discharge Instructions (Signed)
Contact Dermatitis °Contact dermatitis is a rash that happens when something touches the skin. You touched something that irritates your skin, or you have allergies to something you touched. °HOME CARE  °· Avoid the thing that caused your rash. °· Keep your rash away from hot water, soap, sunlight, chemicals, and other things that might bother it. °· Do not scratch your rash. °· You can take cool baths to help stop itching. °· Only take medicine as told by your doctor. °· Keep all doctor visits as told. °GET HELP RIGHT AWAY IF:  °· Your rash is not better after 3 days. °· Your rash gets worse. °· Your rash is puffy (swollen), tender, red, sore, or warm. °· You have problems with your medicine. °MAKE SURE YOU:  °· Understand these instructions. °· Will watch your condition. °· Will get help right away if you are not doing well or get worse. °Document Released: 07/08/2009 Document Revised: 12/03/2011 Document Reviewed: 02/13/2011 °ExitCare® Patient Information ©2015 [employer]. This information is not intended to replace advice given to you by your health care provider. Make sure you discuss any questions you have with your health care provider. ° °

## 2015-09-20 ENCOUNTER — Encounter: Payer: Self-pay | Admitting: *Deleted

## 2015-09-20 ENCOUNTER — Ambulatory Visit
Admission: EM | Admit: 2015-09-20 | Discharge: 2015-09-20 | Disposition: A | Discharge status: Home / Self Care | Payer: No Typology Code available for payment source | Attending: Family Medicine | Admitting: Family Medicine

## 2015-09-20 DIAGNOSIS — W57XXXA Bitten or stung by nonvenomous insect and other nonvenomous arthropods, initial encounter: Secondary | ICD-10-CM | POA: Diagnosis not present

## 2015-09-20 DIAGNOSIS — S60561A Insect bite (nonvenomous) of right hand, initial encounter: Secondary | ICD-10-CM | POA: Diagnosis not present

## 2015-09-20 MED ORDER — CEPHALEXIN 500 MG PO CAPS: 500.0000 mg | ORAL_CAPSULE | Freq: Four times a day (QID) | ORAL | Status: DC

## 2015-09-20 MED ORDER — DOXYCYCLINE HYCLATE 100 MG PO CAPS: 100.0000 mg | ORAL_CAPSULE | Freq: Two times a day (BID) | ORAL | Status: DC

## 2015-09-20 MED ORDER — CEFTRIAXONE SODIUM 1 G IJ SOLR
1.0000 g | Freq: Once | INTRAMUSCULAR | Status: AC
  Administered 2015-09-20: 1 g via INTRAMUSCULAR

## 2015-09-20 MED ORDER — FAMOTIDINE 20 MG PO TABS
20.0000 mg | ORAL_TABLET | Freq: Every day | ORAL | Status: DC
  Administered 2015-09-20 (×4): 20 mg via ORAL

## 2015-09-20 NOTE — ED Notes (Signed)
Patient noticed a pimple on her right "pinky" yesterday. Additional symptoms of redness and swelling have developed and spread over most of her right hand. Site is possibly a spider or insect bite. Patient works at a [employer].

## 2015-09-20 NOTE — ED Notes (Signed)
[facility] [address] contacted as instructed by L. Nedra HaiLee PA. Keflex to be cancelled and in its place Doxycycline 100 mg. Take 1 po bid x 10 days #20 No refills-per L. Nedra HaiLee Georgia[place]

## 2015-09-20 NOTE — Discharge Instructions (Signed)
° °  Zantac 150mg  twice daily  Famotidine 40 mg twice daily Antibiotic as directed Mupirocin ointment to wound Careful handwashing before and after  RTC MMUC 48 hours---more quickly if increased concerns   Insect Bite Mosquitoes, flies, fleas, bedbugs, and many other insects can bite. Insect bites are different from insect stings. A sting is when poison (venom) is injected into the skin. Insect bites can cause pain or itching for a few days, but they are usually not serious. Some insects can spread diseases to people through a bite. SYMPTOMS  Symptoms of an insect bite include:  Itching or pain in the bite area.  Redness and swelling in the bite area.  An open wound (skin ulcer). In many cases, symptoms last for 2-4 days.  DIAGNOSIS  This condition is usually diagnosed based on symptoms and a physical exam. TREATMENT  Treatment is usually not needed for an insect bite. Symptoms often go away on their own. Your health care provider may recommend creams or lotions to help reduce itching. Antibiotic medicines may be prescribed if the bite becomes infected. A tetanus shot may be given in some cases. If you develop an allergic reaction to an insect bite, your health care provider will prescribe medicines to treat the reaction (antihistamines). This is rare. HOME CARE INSTRUCTIONS  Do not scratch the bite area.  Keep the bite area clean and dry. Wash the bite area daily with soap and water as told by your health care provider.  If directed, applyice to the bite area.  Put ice in a plastic bag.  Place a towel between your skin and the bag.  Leave the ice on for 20 minutes, 2-3 times per day.  To help reduce itching and swelling, try applying a baking soda paste, cortisone cream, or calamine lotion to the bite area as told by your health care provider.  Apply or take over-the-counter and prescription medicines only as told by your health care provider.  If you were prescribed an  antibiotic medicine, use it as told by your health care provider. Do not stop using the antibiotic even if your condition improves.  Keep all follow-up visits as told by your health care provider. This is important. PREVENTION   Use insect repellent. The best insect repellents contain:  DEET, picaridin, oil of lemon eucalyptus (OLE), or IR3535.  Higher amounts of an active ingredient.  When you are outdoors, wear clothing that covers your arms and legs.  Avoid opening windows that do not have window screens. SEEK MEDICAL CARE IF:  You have increased redness, swelling, or pain in the bite area.  You have a fever. SEEK IMMEDIATE MEDICAL CARE IF:   You have joint pain.   You have fluid, blood, or pus coming from the bite area.  You have a headache or neck pain.  You have unusual weakness.  You have a rash.  You have chest pain or shortness of breath.  You have abdominal pain, nausea, or vomiting.  You feel unusually tired or sleepy.   This information is not intended to replace advice given to you by your health care provider. Make sure you discuss any questions you have with your health care provider.   Document Released: 10/18/2004 Document Revised: 06/01/2015 Document Reviewed: 01/26/2015 Elsevier Interactive Patient Education [employer]2016 [employer]

## 2015-09-22 ENCOUNTER — Encounter: Payer: Self-pay | Admitting: Physician Assistant

## 2015-09-22 NOTE — ED Provider Notes (Signed)
CSN: [id number]     Arrival date & time 09/20/15  1837 History   First MD Initiated Contact with Patient 09/20/15 2036     Chief Complaint  Patient presents with  . Insect Bite    right "pinky" finger   (Consider location/radiation/quality/duration/timing/severity/associated sxs/prior Treatment) HPI  44 yo F   Yesterday  Insect bite suspected    Right 5th finger dorsum, single spot Dorsum of hand steadily increasing pink, warm, swollen, tender No malaise, no NVD; no fever; no pain up the arm-localized to 5th ifnger and dorsum right hand Denies any trauma, Can't take sulfa Past Medical History  Diagnosis Date  . GERD (gastroesophageal reflux disease)   . Colitis, ischemic [facility](HCC)    Past Surgical History  Procedure Laterality Date  . Abdominal hysterectomy    . Carpal tunnel release     History reviewed. No pertinent family history. Social History  Substance Use Topics  . Smoking status: Never Smoker   . Smokeless tobacco: Never Used  . Alcohol Use: No   OB History    No data available     Review of Systems 10 Systems reviewed and are negative for acute change except as noted in the HPI.  Allergies  Sulfa antibiotics  Home Medications   Prior to Admission medications   Medication Sig Start Date End Date Taking? Authorizing Provider  citalopram (CELEXA) 40 MG tablet Take 40 mg by mouth daily.   Yes Historical Provider, MD  famotidine (PEPCID) 40 MG tablet Take 40 mg by mouth at bedtime.   Yes Historical Provider, MD  omeprazole (PRILOSEC) 20 MG capsule Take 20 mg by mouth daily.   Yes Historical Provider, MD  doxycycline (VIBRAMYCIN) 100 MG capsule Take 1 capsule (100 mg total) by mouth 2 (two) times daily. 09/20/15   Rae HalstedLaurie W Keimora Swartout, PA-C   Meds Ordered and Administered this Visit   Medications  cefTRIAXone (ROCEPHIN) injection 1 g (1 g Intramuscular Given 09/20/15 2051)  well tolerated  BP 135/92 mmHg  Pulse 84  Temp(Src) 98.2 F (36.8 C) (Oral)  Resp 18  Ht 5'  1" (1.549 m)  Wt 160 lb (72.576 kg)  BMI 30.25 kg/m2  SpO2 98% No data found.   Physical Exam   VS noted, WNL GENERAL : NAD, mild discomfort right hand HEENT: no pharyngeal erythema,no exudate, no erythema of TMs, no cervical LAD RESP: CTA  B , no wheezing, no accessory muscle use CARD: RRR ABD: Not distended MSK/SKIN: good function right hand, some swelling--outlined with surgical pen for future reference- dorsum extending over base of 5th- not including 2nd or thumb.; good cap fill; flexion limited by digital swelling, mildly warm dorsum; Single deep red lesion with central weeping of clear fluid- strongly suggestive of insect bite.  NEURO: Good attention, good recall, no gross neuro defecit PSYCH: speech and behavior appropriate ED Course  Procedures (including critical care time)  Labs Review Labs Reviewed - No data to display  Imaging Review No results found.  Medications  cefTRIAXone (ROCEPHIN) injection 1 g (1 g Intramuscular Given 09/20/15 2051)  Famotidine 20 mg po Well tolerated both meds MDM   1. Insect bite of right hand, initial encounter    Cool packs with wrap protection  Ibuprofen 200mg  x 3 tablets ( total  600 Mg)   for pain and inflammation  up to three times a day with meals for 3 days  Zyrtec ( ceterizine) 10 mg  BID  Zantac (ranitidine) 150 mg BID-- patient has  famotidine at home and may use 40 mg BID x3 days  Benadryl 25-50 mg ( 1-2 tablets) as needed for sleep at bedtime  Mupirocin ointment to wound  Doxycycline  BID x 10 days  Retrun for re-evlauation in 48 hours--more quickly if increasing concern   Rae Halsted, PA-C 09/22/15 2126

## 2015-12-23 ENCOUNTER — Encounter: Payer: Self-pay | Admitting: Emergency Medicine

## 2015-12-23 ENCOUNTER — Emergency Department
Admission: EM | Admit: 2015-12-23 | Discharge: 2015-12-23 | Disposition: A | Discharge status: Home / Self Care | Payer: BLUE CROSS/BLUE SHIELD | Attending: Student | Admitting: Student

## 2015-12-23 DIAGNOSIS — M25512 Pain in left shoulder: Secondary | ICD-10-CM | POA: Diagnosis present

## 2015-12-23 DIAGNOSIS — Y9289 Other specified places as the place of occurrence of the external cause: Secondary | ICD-10-CM | POA: Diagnosis not present

## 2015-12-23 DIAGNOSIS — Y9389 Activity, other specified: Secondary | ICD-10-CM | POA: Insufficient documentation

## 2015-12-23 DIAGNOSIS — Z79899 Other long term (current) drug therapy: Secondary | ICD-10-CM | POA: Diagnosis not present

## 2015-12-23 DIAGNOSIS — Y998 Other external cause status: Secondary | ICD-10-CM | POA: Insufficient documentation

## 2015-12-23 DIAGNOSIS — X58XXXA Exposure to other specified factors, initial encounter: Secondary | ICD-10-CM | POA: Diagnosis not present

## 2015-12-23 DIAGNOSIS — S46912A Strain of unspecified muscle, fascia and tendon at shoulder and upper arm level, left arm, initial encounter: Secondary | ICD-10-CM

## 2015-12-23 MED ORDER — KETOROLAC TROMETHAMINE 60 MG/2ML IM SOLN
60.0000 mg | Freq: Once | INTRAMUSCULAR | Status: AC
  Administered 2015-12-23: 60 mg via INTRAMUSCULAR
  Filled 2015-12-23: qty 2

## 2015-12-23 MED ORDER — METHOCARBAMOL 750 MG PO TABS: 1500.0000 mg | ORAL_TABLET | Freq: Four times a day (QID) | ORAL | Status: DC

## 2015-12-23 NOTE — ED Notes (Signed)
Pt presents with left shoulder pain for one week, some rubbing has helped at home but states if feels like a really bad pulled muscle.

## 2015-12-23 NOTE — Discharge Instructions (Signed)
Muscle Strain °A muscle strain (pulled muscle) happens when a muscle is stretched beyond normal length. It happens when a sudden, violent force stretches your muscle too far. Usually, a few of the fibers in your muscle are torn. Muscle strain is common in athletes. Recovery usually takes 1-2 weeks. Complete healing takes 5-6 weeks.  °HOME CARE  °· Follow the PRICE method of treatment to help your injury get better. Do this the first 2-3 days after the injury: °¨ Protect. Protect the muscle to keep it from getting injured again. °¨ Rest. Limit your activity and rest the injured body part. °¨ Ice. Put ice in a plastic bag. Place a towel between your skin and the bag. Then, apply the ice and leave it on from 15-20 minutes each hour. After the third day, switch to moist heat packs. °¨ Compression. Use a splint or elastic bandage on the injured area for comfort. Do not put it on too tightly. °¨ Elevate. Keep the injured body part above the level of your heart. °· Only take medicine as told by your doctor. °· Warm up before doing exercise to prevent future muscle strains. °GET HELP IF:  °· You have more pain or puffiness (swelling) in the injured area. °· You feel numbness, tingling, or notice a loss of strength in the injured area. °MAKE SURE YOU:  °· Understand these instructions. °· Will watch your condition. °· Will get help right away if you are not doing well or get worse. °  °This information is not intended to replace advice given to you by your health care provider. Make sure you discuss any questions you have with your health care provider. °  °Document Released: 06/19/2008 Document Revised: 07/01/2013 Document Reviewed: 04/09/2013 °Elsevier Interactive Patient Education ©2016 [employer] ° °

## 2015-12-23 NOTE — ED Provider Notes (Signed)
[facility] Emergency Department Provider Note  ____________________________________________  Time seen: Approximately 10:11 AM  I have reviewed the triage vital signs and the nursing notes.   HISTORY  Chief Complaint Shoulder Pain    HPI [name] is a 45 y.o. female patient complaining of left posterior muscle shoulder pain for 1 week. As any provocative incident except for her regular work duties. Patient stated pain is increasing and radiating fromInferior to posterior chest wall. Patient states pain increase with abduction and overhead reaching. Patient has been using over-the-counter creams with only mild results. Patient is right-hand dominant.  Past Medical History  Diagnosis Date  . GERD (gastroesophageal reflux disease)   . Colitis, ischemic (HCC)     There are no active problems to display for this patient.   Past Surgical History  Procedure Laterality Date  . Abdominal hysterectomy    . Carpal tunnel release      Current Outpatient Rx  Name  Route  Sig  Dispense  Refill  . citalopram (CELEXA) 40 MG tablet   Oral   Take 40 mg by mouth daily.         Marland Kitchen. doxycycline (VIBRAMYCIN) 100 MG capsule   Oral   Take 1 capsule (100 mg total) by mouth 2 (two) times daily.   20 capsule   0     Please do not fill Rx for Keflex previously submit ...   . famotidine (PEPCID) 40 MG tablet   Oral   Take 40 mg by mouth at bedtime.         Marland Kitchen. omeprazole (PRILOSEC) 20 MG capsule   Oral   Take 20 mg by mouth daily.           Allergies Sulfa antibiotics  No family history on file.  Social History Social History  Substance Use Topics  . Smoking status: Never Smoker   . Smokeless tobacco: Never Used  . Alcohol Use: No    Review of Systems Constitutional: No fever/chills Eyes: No visual changes. ENT: No sore throat. Cardiovascular: Denies chest pain. Respiratory: Denies shortness of breath. Gastrointestinal: No abdominal  pain.  No nausea, no vomiting.  No diarrhea.  No constipation. Genitourinary: Negative for dysuria. Musculoskeletal: Scapulary shoulder pain  Skin: Negative for rash. Neurological: Negative for headaches, focal weakness or numbness.    ____________________________________________   PHYSICAL EXAM:  VITAL SIGNS: ED Triage Vitals  Enc Vitals Group     BP 12/23/15 1000 156/91 mmHg     Pulse Rate 12/23/15 1000 91     Resp 12/23/15 1000 20     Temp --      Temp src --      SpO2 12/23/15 1000 98 %     Weight 12/23/15 1000 170 lb (77.111 kg)     Height 12/23/15 1000 5\' 1"  (1.549 m)     Head Cir --      Peak Flow --      Pain Score 12/23/15 1000 9     Pain Loc --      Pain Edu? --      Excl. in GC? --     Constitutional: Alert and oriented. Well appearing and in no acute distress. Eyes: Conjunctivae are normal. PERRL. EOMI. Head: Atraumatic. Nose: No congestion/rhinnorhea. Mouth/Throat: Mucous membranes are moist.  Oropharynx non-erythematous. Neck: No stridor.  No cervical spine tenderness to palpation. Hematological/Lymphatic/Immunilogical: No cervical lymphadenopathy. Cardiovascular: Normal rate, regular rhythm. Grossly normal heart sounds.  Good peripheral circulation. Respiratory:  Normal respiratory effort.  No retractions. Lungs CTAB. Gastrointestinal: Soft and nontender. No distention. No abdominal bruits. No CVA tenderness. Musculoskeletal: No obvious deformities the left upper extremity. Patient has some obvious guarding palpation of the scapular muscle area. Patient has decreased range of motion limited by complaining of pain with abduction and overhead reaching. Patient struck his resistance 3 over 5.  No gross focal neurologic deficits are appreciated. No gait instability. Skin:  Skin is warm, dry and intact. No rash noted. Psychiatric: Mood and affect are normal. Speech and behavior are normal.  ____________________________________________   LABS (all labs  ordered are listed, but only abnormal results are displayed)  Labs Reviewed - No data to display ____________________________________________  EKG   ____________________________________________  RADIOLOGY   ____________________________________________   PROCEDURES  Procedure(s) performed: None  Critical Care performed: No  ____________________________________________   INITIAL IMPRESSION / ASSESSMENT AND PLAN / ED COURSE  Pertinent labs & imaging results that were available during my care of the patient were reviewed by me and considered in my medical decision making (see chart for details).  Left scapular muscle strain. She given discharge care instructions. Patient get a prescription for Robaxin. Patient advised follow-up family doctor in 3 days if no improvement. ____________________________________________   FINAL CLINICAL IMPRESSION(S) / ED DIAGNOSES  Final diagnoses:  None      Joni Reining, PA-C 12/23/15 1036  Gayla Doss, MD 12/23/15 1623

## 2015-12-23 NOTE — ED Notes (Signed)
Patient reports pain to left pectoral, shoulder area after doing yard work for several days.

## 2016-05-24 ENCOUNTER — Ambulatory Visit: Payer: BLUE CROSS/BLUE SHIELD | Admitting: Family

## 2016-05-24 DIAGNOSIS — Z0289 Encounter for other administrative examinations: Secondary | ICD-10-CM

## 2016-09-02 ENCOUNTER — Emergency Department: Payer: BLUE CROSS/BLUE SHIELD

## 2016-09-02 ENCOUNTER — Emergency Department
Admission: EM | Admit: 2016-09-02 | Discharge: 2016-09-02 | Disposition: A | Discharge status: Home / Self Care | Payer: BLUE CROSS/BLUE SHIELD | Attending: Emergency Medicine | Admitting: Emergency Medicine

## 2016-09-02 ENCOUNTER — Encounter: Payer: Self-pay | Admitting: Medical Oncology

## 2016-09-02 DIAGNOSIS — Y929 Unspecified place or not applicable: Secondary | ICD-10-CM | POA: Diagnosis not present

## 2016-09-02 DIAGNOSIS — Y9389 Activity, other specified: Secondary | ICD-10-CM | POA: Diagnosis not present

## 2016-09-02 DIAGNOSIS — M25562 Pain in left knee: Secondary | ICD-10-CM | POA: Insufficient documentation

## 2016-09-02 DIAGNOSIS — S8992XA Unspecified injury of left lower leg, initial encounter: Secondary | ICD-10-CM | POA: Diagnosis present

## 2016-09-02 DIAGNOSIS — Y999 Unspecified external cause status: Secondary | ICD-10-CM | POA: Diagnosis not present

## 2016-09-02 DIAGNOSIS — X58XXXA Exposure to other specified factors, initial encounter: Secondary | ICD-10-CM | POA: Insufficient documentation

## 2016-09-02 MED ORDER — KETOROLAC TROMETHAMINE 60 MG/2ML IM SOLN
60.0000 mg | Freq: Once | INTRAMUSCULAR | Status: AC
  Administered 2016-09-02: 60 mg via INTRAMUSCULAR
  Filled 2016-09-02: qty 2

## 2016-09-02 MED ORDER — MELOXICAM 7.5 MG PO TABS
7.5000 mg | ORAL_TABLET | Freq: Every day | ORAL | 2 refills | Status: DC
Start: 2016-09-02 — End: 2016-11-05

## 2016-09-02 NOTE — ED Triage Notes (Signed)
Pt reports she was getting out of her car Friday and felt a pull to left knee.

## 2016-09-02 NOTE — ED Provider Notes (Signed)
[facility] Emergency Department Provider Note ____________________________________________  Time seen: Approximately 4:07 PM  I have reviewed the triage vital signs and the nursing notes.   HISTORY  Chief Complaint Knee Pain    HPI [name] is a 45 y.o. female presenting with chronic dull, non-radiating, aching 6/10 left knee pain. Patient states that she was getting out of her car on Friday when she felt a "pop". No falls. Since this time, her chronic left knee pain has been exacerbated. Patient has tried compression, elevation and ibuprofen to relieve her left knee pain. She denies prior surgeries to the left knee.  Past Medical History:  Diagnosis Date  . Colitis, ischemic (HCC)   . GERD (gastroesophageal reflux disease)     There are no active problems to display for this patient.   Past Surgical History:  Procedure Laterality Date  . ABDOMINAL HYSTERECTOMY    . CARPAL TUNNEL RELEASE      Prior to Admission medications   Medication Sig Start Date End Date Taking? Authorizing Provider  citalopram (CELEXA) 40 MG tablet Take 40 mg by mouth daily.    Historical Provider, MD  doxycycline (VIBRAMYCIN) 100 MG capsule Take 1 capsule (100 mg total) by mouth 2 (two) times daily. 09/20/15   Rae HalstedLaurie W Lee, PA-C  famotidine (PEPCID) 40 MG tablet Take 40 mg by mouth at bedtime.    Historical Provider, MD  meloxicam (MOBIC) 7.5 MG tablet Take 1 tablet (7.5 mg total) by mouth daily. 09/02/16 09/02/17  Orvil FeilJaclyn M Donell Tomkins, PA-C  methocarbamol (ROBAXIN-750) 750 MG tablet Take 2 tablets (1,500 mg total) by mouth 4 (four) times daily. 12/23/15   Joni Reiningonald K Smith, PA-C  omeprazole (PRILOSEC) 20 MG capsule Take 20 mg by mouth daily.    Historical Provider, MD    Allergies Sulfa antibiotics  No family history on file.  Social History Social History  Substance Use Topics  . Smoking status: Never Smoker  . Smokeless tobacco: Never Used  . Alcohol use No     Review of Systems Constitutional: No recent illness. Cardiovascular: Denies chest pain or palpitations. Respiratory: Denies shortness of breath. Musculoskeletal: Pain in left knee  Skin: Negative for rash, wound, lesion. Neurological: Negative for focal weakness or numbness.  ____________________________________________   PHYSICAL EXAM:  VITAL SIGNS: ED Triage Vitals  Enc Vitals Group     BP 09/02/16 1232 (!) 168/89     Pulse Rate 09/02/16 1232 94     Resp 09/02/16 1523 16     Temp 09/02/16 1232 98.6 F (37 C)     Temp Source 09/02/16 1232 Oral     SpO2 09/02/16 1232 98 %     Weight 09/02/16 1216 170 lb (77.1 kg)     Height --      Head Circumference --      Peak Flow --      Pain Score 09/02/16 1216 8     Pain Loc --      Pain Edu? --      Excl. in GC? --     Constitutional: Alert and oriented. Well appearing and in no acute distress. Head: Atraumatic.  Respiratory: Normal respiratory effort.   Musculoskeletal: Patient has 5/5 strength in the lower extremities bilaterally. Full range of motion at the hip, knee and ankle, right. Patient has limited flexion at the left knee due to pain. The extensor mechanism is intact, left. Negative anterior and posterior drawer test, left. Patient is exquisitely tender along the  medial left knee joint. Reflexes are 2+ and symmetric. Palpable left dorsalis pedis pulse. Left knee appears moderately effused.  Neurologic:  Normal speech and language. No gross focal neurologic deficits are appreciated. Speech is normal. No gait instability. Skin:  Skin is warm, dry and intact. Atraumatic. No bruising visualized.  Psychiatric: Mood and affect are normal. Speech and behavior are normal.  ____________________________________________   LABS (all labs ordered are listed, but only abnormal results are displayed)  Labs Reviewed - No data to display ____________________________________________  RADIOLOGY  I, Orvil FeilJaclyn M Generoso Cropper, personally  viewed and evaluated these images (plain radiographs) as part of my medical decision making, as well as reviewing the written report by the radiologist.  DG Knee Complete: No fractures or dislocations visualized. Small joint effusion visualized.   PROCEDURES  Procedure(s) performed:  Toradol   ____________________________________________   INITIAL IMPRESSION / ASSESSMENT AND PLAN / ED COURSE  Clinical Course     Pertinent labs & imaging results that were available during my care of the patient were reviewed by me and considered in my medical decision making (see chart for details).  Assessment and plan: Patient has small left knee effusion. She has tenderness along the left medial knee joint. Differential diagnosis includes knee arthritis and meniscal injury. DG knee complete reveals no fractures or bony abnormalities. Patient was given a prescription of Mobic at discharge. A referral was made to the orthopedist on-call, Dr. Martha ClanKrasinski. Patient was advised to make an appointment this week. Patient's blood pressure was within reference range at discharge. Vital signs noted at triage. All patient questions were answered. ____________________________________________   FINAL CLINICAL IMPRESSION(S) / ED DIAGNOSES  Final diagnoses:  Acute pain of left knee       Orvil FeilJaclyn M Nhyira Leano, PA-C 09/02/16 1617    Sharyn CreamerMark Quale, MD 09/09/16 0011

## 2016-11-01 ENCOUNTER — Emergency Department: Payer: BLUE CROSS/BLUE SHIELD

## 2016-11-01 ENCOUNTER — Encounter: Payer: Self-pay | Admitting: Internal Medicine

## 2016-11-01 ENCOUNTER — Inpatient Hospital Stay
Admission: EM | Admit: 2016-11-01 | Discharge: 2016-11-05 | DRG: 469 | Disposition: A | Discharge status: Home Health | Payer: BLUE CROSS/BLUE SHIELD | Attending: Internal Medicine | Admitting: Internal Medicine

## 2016-11-01 ENCOUNTER — Inpatient Hospital Stay: Payer: BLUE CROSS/BLUE SHIELD

## 2016-11-01 DIAGNOSIS — E669 Obesity, unspecified: Secondary | ICD-10-CM | POA: Diagnosis present

## 2016-11-01 DIAGNOSIS — R0602 Shortness of breath: Secondary | ICD-10-CM

## 2016-11-01 DIAGNOSIS — K219 Gastro-esophageal reflux disease without esophagitis: Secondary | ICD-10-CM | POA: Diagnosis present

## 2016-11-01 DIAGNOSIS — W19XXXA Unspecified fall, initial encounter: Secondary | ICD-10-CM

## 2016-11-01 DIAGNOSIS — Z87891 Personal history of nicotine dependence: Secondary | ICD-10-CM | POA: Diagnosis not present

## 2016-11-01 DIAGNOSIS — Z800 Family history of malignant neoplasm of digestive organs: Secondary | ICD-10-CM

## 2016-11-01 DIAGNOSIS — Z96649 Presence of unspecified artificial hip joint: Secondary | ICD-10-CM

## 2016-11-01 DIAGNOSIS — Z6832 Body mass index (BMI) 32.0-32.9, adult: Secondary | ICD-10-CM | POA: Diagnosis not present

## 2016-11-01 DIAGNOSIS — J189 Pneumonia, unspecified organism: Secondary | ICD-10-CM | POA: Diagnosis present

## 2016-11-01 DIAGNOSIS — M25551 Pain in right hip: Secondary | ICD-10-CM | POA: Diagnosis present

## 2016-11-01 DIAGNOSIS — Z79899 Other long term (current) drug therapy: Secondary | ICD-10-CM | POA: Diagnosis not present

## 2016-11-01 DIAGNOSIS — G8929 Other chronic pain: Secondary | ICD-10-CM | POA: Diagnosis present

## 2016-11-01 DIAGNOSIS — M84451A Pathological fracture, right femur, initial encounter for fracture: Secondary | ICD-10-CM | POA: Diagnosis present

## 2016-11-01 DIAGNOSIS — K5900 Constipation, unspecified: Secondary | ICD-10-CM | POA: Diagnosis present

## 2016-11-01 DIAGNOSIS — Z8249 Family history of ischemic heart disease and other diseases of the circulatory system: Secondary | ICD-10-CM | POA: Diagnosis not present

## 2016-11-01 DIAGNOSIS — Z90710 Acquired absence of both cervix and uterus: Secondary | ICD-10-CM | POA: Diagnosis not present

## 2016-11-01 DIAGNOSIS — T148XXA Other injury of unspecified body region, initial encounter: Secondary | ICD-10-CM

## 2016-11-01 DIAGNOSIS — M797 Fibromyalgia: Secondary | ICD-10-CM | POA: Insufficient documentation

## 2016-11-01 DIAGNOSIS — R262 Difficulty in walking, not elsewhere classified: Secondary | ICD-10-CM

## 2016-11-01 DIAGNOSIS — M6281 Muscle weakness (generalized): Secondary | ICD-10-CM

## 2016-11-01 DIAGNOSIS — M84459A Pathological fracture, hip, unspecified, initial encounter for fracture: Secondary | ICD-10-CM | POA: Diagnosis present

## 2016-11-01 HISTORY — DX: Vitamin D deficiency, unspecified: E55.9

## 2016-11-01 HISTORY — DX: Fibromyalgia: M79.7

## 2016-11-01 LAB — COMPREHENSIVE METABOLIC PANEL
ALT: 19 U/L (ref 14–54)
ANION GAP: 8 (ref 5–15)
AST: 20 U/L (ref 15–41)
Albumin: 4.1 g/dL (ref 3.5–5.0)
Alkaline Phosphatase: 71 U/L (ref 38–126)
BUN: 5 mg/dL — ABNORMAL LOW (ref 6–20)
CHLORIDE: 98 mmol/L — AB (ref 101–111)
CO2: 30 mmol/L (ref 22–32)
CREATININE: 0.66 mg/dL (ref 0.44–1.00)
Calcium: 9.2 mg/dL (ref 8.9–10.3)
GFR calc non Af Amer: >60 mL/min (ref >60)
Glucose, Bld: 98 mg/dL (ref 65–99)
Potassium: 4 mmol/L (ref 3.5–5.1)
SODIUM: 136 mmol/L (ref 135–145)
Total Bilirubin: 0.3 mg/dL (ref 0.3–1.2)
Total Protein: 7.3 g/dL (ref 6.5–8.1)

## 2016-11-01 LAB — CORTISOL: CORTISOL PLASMA: 1.4 ug/dL

## 2016-11-01 LAB — URINALYSIS, COMPLETE (UACMP) WITH MICROSCOPIC
BACTERIA UA: NONE SEEN
BILIRUBIN URINE: NEGATIVE
Glucose, UA: NEGATIVE mg/dL
KETONES UR: NEGATIVE mg/dL
LEUKOCYTES UA: NEGATIVE
NITRITE: NEGATIVE
Protein, ur: NEGATIVE mg/dL
RBC / HPF: NONE SEEN RBC/hpf (ref 0–5)
Specific Gravity, Urine: 1.001 — ABNORMAL LOW (ref 1.005–1.030)
WBC UA: NONE SEEN WBC/hpf (ref 0–5)
pH: 7 (ref 5.0–8.0)

## 2016-11-01 LAB — URINE DRUG SCREEN, QUALITATIVE (ARMC ONLY)
Amphetamines, Ur Screen: NOT DETECTED
BARBITURATES, UR SCREEN: NOT DETECTED
Benzodiazepine, Ur Scrn: NOT DETECTED
CANNABINOID 50 NG, UR ~~LOC~~: NOT DETECTED
Cocaine Metabolite,Ur ~~LOC~~: NOT DETECTED
MDMA (Ecstasy)Ur Screen: NOT DETECTED
Methadone Scn, Ur: NOT DETECTED
Opiate, Ur Screen: NOT DETECTED
Phencyclidine (PCP) Ur S: NOT DETECTED
TRICYCLIC, UR SCREEN: NOT DETECTED

## 2016-11-01 LAB — CBC
HCT: 39.8 % (ref 35.0–47.0)
Hemoglobin: 13.1 g/dL (ref 12.0–16.0)
MCH: 29.1 pg (ref 26.0–34.0)
MCHC: 32.8 g/dL (ref 32.0–36.0)
MCV: 88.6 fL (ref 80.0–100.0)
PLATELETS: 325 10*3/uL (ref 150–440)
RBC: 4.49 MIL/uL (ref 3.80–5.20)
RDW: 14.7 % — ABNORMAL HIGH (ref 11.5–14.5)
WBC: 12.9 10*3/uL — ABNORMAL HIGH (ref 3.6–11.0)

## 2016-11-01 LAB — TSH: TSH: 1.863 u[IU]/mL (ref 0.350–4.500)

## 2016-11-01 LAB — CORTISOL-AM, BLOOD: CORTISOL - AM: 1.4 ug/dL — AB (ref 6.7–22.6)

## 2016-11-01 MED ORDER — CEFAZOLIN SODIUM-DEXTROSE 2-4 GM/100ML-% IV SOLN: 2.0000 g | INTRAVENOUS | Status: DC

## 2016-11-01 MED ORDER — FAMOTIDINE 20 MG PO TABS
40.0000 mg | ORAL_TABLET | Freq: Every day | ORAL | Status: DC
  Administered 2016-11-01 – 2016-11-04 (×4): 40 mg via ORAL
  Filled 2016-11-01 (×4): qty 2

## 2016-11-01 MED ORDER — MORPHINE SULFATE (PF) 2 MG/ML IV SOLN
2.0000 mg | INTRAVENOUS | Status: DC | PRN
  Administered 2016-11-01 – 2016-11-02 (×4): 2 mg via INTRAVENOUS
  Filled 2016-11-01 (×4): qty 1

## 2016-11-01 MED ORDER — CEFAZOLIN SODIUM-DEXTROSE 2-3 GM-% IV SOLR
2.0000 g | INTRAVENOUS | Status: AC
  Administered 2016-11-02: 2 g via INTRAVENOUS
  Filled 2016-11-01: qty 50

## 2016-11-01 MED ORDER — IOPAMIDOL (ISOVUE-300) INJECTION 61%
75.0000 mL | Freq: Once | INTRAVENOUS | Status: AC | PRN
  Administered 2016-11-01: 75 mL via INTRAVENOUS
  Filled 2016-11-01: qty 75

## 2016-11-01 MED ORDER — OXYCODONE-ACETAMINOPHEN 5-325 MG PO TABS
1.0000 | ORAL_TABLET | Freq: Four times a day (QID) | ORAL | Status: DC | PRN
  Administered 2016-11-01 – 2016-11-02 (×2): 1 via ORAL
  Filled 2016-11-01 (×2): qty 1

## 2016-11-01 MED ORDER — METHOCARBAMOL 500 MG PO TABS
500.0000 mg | ORAL_TABLET | Freq: Three times a day (TID) | ORAL | Status: DC | PRN
  Administered 2016-11-01 – 2016-11-04 (×6): 500 mg via ORAL
  Filled 2016-11-01 (×6): qty 1

## 2016-11-01 MED ORDER — ENOXAPARIN SODIUM 40 MG/0.4ML ~~LOC~~ SOLN: 40.0000 mg | SUBCUTANEOUS | Status: DC

## 2016-11-01 NOTE — ED Triage Notes (Signed)
Pt reports throbbing pain to right hip and leg that began yesterday.

## 2016-11-01 NOTE — H&P (Signed)
[facility] - [place] [facility]   PATIENT NAME: [name]    MR#:  [id number]  DATE OF BIRTH:  [birth year]  DATE OF ADMISSION:  11/01/2016  PRIMARY CARE PHYSICIAN: No PCP Per Patient   REQUESTING/REFERRING PHYSICIAN: Eber Kennedy  CHIEF COMPLAINT:   Chief Complaint  Patient presents with  . Hip Pain  . Leg Pain    HISTORY OF PRESENT ILLNESS: [name]  is a 46 y.o. female with a known history of Colitis, gastroesophageal reflux disease, some unknown 16 disease with dryness on the skin which was never diagnosed in her PMD started on oral steroid which she is taking for more than one year now, recently 2 weeks ago she stopped taking those steroids. She will also take Prilosec 40 mg 2 times a day oral for her reflux symptoms, but was never diagnosed for any ulcers in the stomach. She had old injury on her left hand and after surgery she was prescribed pain medication which she got addicted to that with help of the primary care and pain management she was started on Suboxone, which was 2 years ago and since then she is taking Suboxone every day. She confirms on and off complaints of constipation, but denies excessive feeling of cold or heat. She denies noticing any blood in her stool or having excessive cough or blood in sputum. She confirms gaining some weight.  For last 3-4 days she is feeling weak and some generalized muscle aches, but today morning when she tried to get up and walk she heard a loud pop on her right side of the hip and started having severe pain over there and so came to emergency room. Found to have a fracture on her right hip. ER physician spoke to orthopedic doctor and he suggested as this is pathological fracture he would like to have medicine admit this patient and find out the reason for that and then he can do the surgery.  PAST MEDICAL HISTORY:   Past Medical History:  Diagnosis Date  . Colitis, ischemic (HCC)   . GERD (gastroesophageal reflux  disease)     PAST SURGICAL HISTORY: Past Surgical History:  Procedure Laterality Date  . ABDOMINAL HYSTERECTOMY    . CARPAL TUNNEL RELEASE      SOCIAL HISTORY:  Social History  Substance Use Topics  . Smoking status: Former Games developer  . Smokeless tobacco: Never Used  . Alcohol use No    FAMILY HISTORY:  Family History  Problem Relation Age of Onset  . CAD Mother   . CAD Father   . Colon cancer Maternal Grandfather     DRUG ALLERGIES:  Allergies  Allergen Reactions  . Sulfa Antibiotics Rash    REVIEW OF SYSTEMS:   CONSTITUTIONAL: No fever,Positive for fatigue or weakness.  EYES: No blurred or double vision.  EARS, NOSE, AND THROAT: No tinnitus or ear pain.  RESPIRATORY: No cough, shortness of breath, wheezing or hemoptysis.  CARDIOVASCULAR: No chest pain, orthopnea, edema.  GASTROINTESTINAL: No nausea, vomiting, diarrhea or abdominal pain.  GENITOURINARY: No dysuria, hematuria.  ENDOCRINE: No polyuria, nocturia,  HEMATOLOGY: No anemia, easy bruising or bleeding SKIN: No rash or lesion. MUSCULOSKELETAL: Right hip joint pain.   NEUROLOGIC: No tingling, numbness, weakness.  PSYCHIATRY: No anxiety or depression.   MEDICATIONS AT HOME:  Prior to Admission medications   Medication Sig Start Date End Date Taking? Authorizing Provider  citalopram (CELEXA) 40 MG tablet Take 40 mg by mouth daily.   Yes Historical Provider,  MD  famotidine (PEPCID) 40 MG tablet Take 40 mg by mouth at bedtime.   Yes Historical Provider, MD  omeprazole (PRILOSEC) 20 MG capsule Take 20 mg by mouth daily.   Yes Historical Provider, MD  SUBOXONE 8-2 MG FILM Place 1 Film under the tongue 2 (two) times daily. 10/31/16  Yes Historical Provider, MD  doxycycline (VIBRAMYCIN) 100 MG capsule Take 1 capsule (100 mg total) by mouth 2 (two) times daily. Patient not taking: Reported on 11/01/2016 09/20/15   Jennifer Halsted, PA-C  meloxicam (MOBIC) 7.5 MG tablet Take 1 tablet (7.5 mg total) by mouth daily. Patient  not taking: Reported on 11/01/2016 09/02/16 09/02/17  Jennifer Feil, PA-C  methocarbamol (ROBAXIN-750) 750 MG tablet Take 2 tablets (1,500 mg total) by mouth 4 (four) times daily. Patient not taking: Reported on 11/01/2016 12/23/15   Jennifer Reining, PA-C      PHYSICAL EXAMINATION:   VITAL SIGNS: Blood pressure (!) 166/80, pulse 93, temperature 98 F (36.7 C), temperature source Oral, resp. rate 18, height 5\' 2"  (1.575 m), weight 79.4 kg (175 lb), SpO2 100 %.  GENERAL:  46 y.o.-year-old patient lying in the bed with acute distress, due to pain.  EYES: Pupils equal, round, reactive to light and accommodation. No scleral icterus. Extraocular muscles intact.  HEENT: Head atraumatic, normocephalic. Oropharynx and nasopharynx clear. She has puffy face and neck.  NECK:  Supple, no jugular venous distention. No thyroid enlargement, no tenderness.  LUNGS: Normal breath sounds bilaterally, no wheezing, rales,rhonchi or crepitation. No use of accessory muscles of respiration.  CARDIOVASCULAR: S1, S2 normal. No murmurs, rubs, or gallops.  ABDOMEN: Soft, nontender, nondistended. Bowel sounds present. No organomegaly or mass.  EXTREMITIES: No pedal edema, cyanosis, or clubbing. Distal pulses are palpable easily on right lower extremity. NEUROLOGIC: Cranial nerves II through XII are intact. Muscle strength 5/5 in all extremities except right lower extremity she is not moving much because of fracture and pain. Sensation intact. Gait not checked.  PSYCHIATRIC: The patient is alert and oriented x 3.  SKIN: No obvious rash, lesion, or ulcer. Her skin on both her hands is very dry with some cracking on her fingers.  LABORATORY PANEL:   CBC  Recent Labs Lab 11/01/16 1442  WBC 12.9*  HGB 13.1  HCT 39.8  PLT 325  MCV 88.6  MCH 29.1  MCHC 32.8  RDW 14.7*   ------------------------------------------------------------------------------------------------------------------  Chemistries   Recent Labs Lab  11/01/16 1442  NA 136  K 4.0  CL 98*  CO2 30  GLUCOSE 98  BUN 5*  CREATININE 0.66  CALCIUM 9.2  AST 20  ALT 19  ALKPHOS 71  BILITOT 0.3   ------------------------------------------------------------------------------------------------------------------ estimated creatinine clearance is 84.8 mL/min (by C-G formula based on SCr of 0.66 mg/dL). ------------------------------------------------------------------------------------------------------------------  Recent Labs  11/01/16 1442  TSH 1.863     Coagulation profile No results for input(s): INR, PROTIME in the last 168 hours. ------------------------------------------------------------------------------------------------------------------- No results for input(s): DDIMER in the last 72 hours. -------------------------------------------------------------------------------------------------------------------  Cardiac Enzymes No results for input(s): CKMB, TROPONINI, MYOGLOBIN in the last 168 hours.  Invalid input(s): CK ------------------------------------------------------------------------------------------------------------------ Invalid input(s): POCBNP  ---------------------------------------------------------------------------------------------------------------  Urinalysis    Component Value Date/Time   COLORURINE COLORLESS (A) 11/01/2016 1428   APPEARANCEUR CLEAR (A) 11/01/2016 1428   APPEARANCEUR Clear 05/11/2013 1318   LABSPEC 1.001 (L) 11/01/2016 1428   LABSPEC 1.004 05/11/2013 1318   PHURINE 7.0 11/01/2016 1428   GLUCOSEU NEGATIVE 11/01/2016 1428   GLUCOSEU Negative 05/11/2013 1318  HGBUR SMALL (A) 11/01/2016 1428   BILIRUBINUR NEGATIVE 11/01/2016 1428   BILIRUBINUR Negative 05/11/2013 1318   KETONESUR NEGATIVE 11/01/2016 1428   PROTEINUR NEGATIVE 11/01/2016 1428   NITRITE NEGATIVE 11/01/2016 1428   LEUKOCYTESUR NEGATIVE 11/01/2016 1428   LEUKOCYTESUR Negative 05/11/2013 1318      RADIOLOGY: Dg Chest 1 View  Result Date: 11/01/2016 CLINICAL DATA:  Stood up and heard a loud pop, now cannot bear weight EXAM: CHEST 1 VIEW COMPARISON:  Chest x-ray of 12/06/2002 T FINDINGS: No active infiltrate or effusion is seen. Probable minimal scarring is noted peripherally in the right mid lung. Mediastinal and hilar contours are unremarkable. The heart is within normal limits in size. No bony abnormality is seen. IMPRESSION: No definite active process. Question mild scarring peripherally in the right mid lung. Electronically Signed   By: Dwyane DeePaul  Barry M.D.   On: 11/01/2016 14:34   Mr Hip Right Wo Contrast  Result Date: 11/01/2016 CLINICAL DATA:  Evaluate right femoral neck fracture. EXAM: MR OF THE RIGHT HIP WITHOUT CONTRAST TECHNIQUE: Multiplanar, multisequence MR imaging was performed. No intravenous contrast was administered. COMPARISON:  Radiographs 11/01/2016 FINDINGS: There is a displaced basicervical neck fracture of the right hip. This is not an acute injury. There may be some early/partial healing. No marrow edema or surrounding edema, fluid or hematoma to suggest an acute injury. The left hip is intact. Mild to moderate bilateral hip joint degenerative changes. The pubic symphysis and SI joints are intact. Mild degenerative changes. No pelvic fractures or bone lesions. Normal-appearing sacral nerve roots, surrounded by fat in the neural foramen. No significant intrapelvic abnormalities are demonstrated. The surrounding hip and pelvic musculature appear normal. No muscle tear or myositis. Small inguinal hernias containing fat. IMPRESSION: Remote displaced basicervical neck fracture of the right hip. No acute muscle injury or hematoma. No significant intrapelvic abnormalities. Electronically Signed   By: Rudie MeyerP.  Gallerani M.D.   On: 11/01/2016 16:51   Dg Hip Unilat W Or Wo Pelvis 2-3 Views Right  Result Date: 11/01/2016 CLINICAL DATA:  Stood up and heard pop, unable to bear weight EXAM: DG  HIP (WITH OR WITHOUT PELVIS) 2-3V RIGHT COMPARISON:  None. FINDINGS: There is an angulated acute fracture of the right intertrochanteric femur with varus deformity. The left hip is intact. The pelvic rami are intact. IMPRESSION: Acute angulated right femoral intertrochanteric fracture with varus deformity. Electronically Signed   By: Dwyane DeePaul  Barry M.D.   On: 11/01/2016 14:33    EKG: Orders placed or performed in visit on 12/05/12  . EKG 12-Lead    IMPRESSION AND PLAN:  * Pathological fracture of right hip   Patient was chronically on oral steroid for 1 year without any clear diagnosis of skin disease.   She'll also have clinical findings of  Puffy face and her neck and torso.   TSH, vitamin D, parathyroid hormone, serum cortisol.   As patient was a chronic smoker and there is some finding of fibrosis on chest x-ray I will do a CT chest also to rule out any malignancy. This CT scan will also give idea about presence of osteoporosis.   Endocrinology consult is requested but because of evening offices are closed and we may need to call and speak to the specialist tomorrow during the daytime.    Further management of the fracture is as per orthopedic physician. Meanwhile I will give DVT prophylaxis and pain management.  * Chronic pain   She is taking Suboxone at home chronically.  I encouraged her to taper off and come off the pain medicines.  * Gastroesophageal reflux disease   Continue famotidine.   All the records are reviewed and case discussed with ED provider. Management plans discussed with the patient, family and they are in agreement.  CODE STATUS: full. Code Status History    This patient does not have a recorded code status. Please follow your organizational policy for patients in this situation.       TOTAL TIME TAKING CARE OF THIS PATIENT: 50 minutes.    Altamese Dilling M.D on 11/01/2016   Between 7am to 6pm - Pager - [phone]  After 6pm go to  [email or web address] - Social research officer, government  [facility]  Office  [phone]  CC: Primary care physician; No PCP Per Patient   Note: This dictation was prepared with Dragon dictation along with smaller phrase technology. Any transcriptional errors that result from this process are unintentional.

## 2016-11-01 NOTE — ED Notes (Signed)
Pt states she heard something pop in her right leg and states ever since continued right hip pain, states she is unable to bear weigh, pt screamed when assisted to bed, pt has TENS unit on leg

## 2016-11-01 NOTE — ED Provider Notes (Signed)
[facility] Emergency Department Provider Note  ____________________________________________  Time seen: Approximately 2:02 PM  I have reviewed the triage vital signs and the nursing notes.   HISTORY  Chief Complaint Hip Pain and Leg Pain    HPI [name] is a 46 y.o. female with PMH of GERD presents to emergency department with 4 days of right hip pain. Patient states that the hip pain has progressively gotten worse since the beginning of the week. He begins in the chest and radiates down side of leg to knee. Patient is unable to move her leg in any direction currently. This morning patient heard a pop and has not been able to walk since. Patient has never broken a bone before. No history of cancer . Patient has never used intravenous drugs but has tried marijuana and cocaine previously. Patient was on 20 mg of prednisone for 1 year. Patient smoked 1ppd for 30+. Patient currently takes an SSRI and Prilosec. 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 Patient denies fever, shortness of breath, chest pain, nausea, vomiting, abdominal pain, back pain, numbness, tingling.   Past Medical History:  Diagnosis Date  . Colitis, ischemic (HCC)   . GERD (gastroesophageal reflux disease)     Patient Active Problem List   Diagnosis Date Noted  . Pathological fracture 11/01/2016  . Pathological fracture, hip, unsp, init encntr for fracture (HCC) 11/01/2016    Past Surgical History:  Procedure Laterality Date  . ABDOMINAL HYSTERECTOMY    . CARPAL TUNNEL RELEASE      Prior to Admission medications   Medication Sig Start Date End Date Taking? Authorizing Provider  citalopram (CELEXA) 40 MG tablet Take 40 mg by mouth daily.   Yes Historical Provider, MD  famotidine (PEPCID) 40 MG tablet Take 40 mg by mouth at bedtime.   Yes Historical Provider, MD  omeprazole (PRILOSEC) 20 MG capsule Take 20 mg by mouth daily.   Yes Historical  Provider, MD  SUBOXONE 8-2 MG FILM Place 1 Film under the tongue 2 (two) times daily. 10/31/16  Yes Historical Provider, MD  doxycycline (VIBRAMYCIN) 100 MG capsule Take 1 capsule (100 mg total) by mouth 2 (two) times daily. Patient not taking: Reported on 11/01/2016 09/20/15   Rae Halsted, PA-C  meloxicam (MOBIC) 7.5 MG tablet Take 1 tablet (7.5 mg total) by mouth daily. Patient not taking: Reported on 11/01/2016 09/02/16 09/02/17  Orvil Feil, PA-C  methocarbamol (ROBAXIN-750) 750 MG tablet Take 2 tablets (1,500 mg total) by mouth 4 (four) times daily. Patient not taking: Reported on 11/01/2016 12/23/15   Joni Reining, PA-C    Allergies Sulfa antibiotics  Family History  Problem Relation Age of Onset  . CAD Mother   . CAD Father   . Colon cancer Maternal Grandfather     Social History Social History  Substance Use Topics  . Smoking status: Former Games developer  . Smokeless tobacco: Never Used  . Alcohol use No     Review of Systems  Constitutional: No fever/chills ENT: No upper respiratory complaints. Cardiovascular: No chest pain. Respiratory: No cough. No SOB. Gastrointestinal: No abdominal pain.  No nausea, no vomiting.  Genitourinary: Negative for dysuria. Skin: Negative for rash, abrasions, lacerations, ecchymosis. Neurological: Negative for headaches, numbness or tingling   ____________________________________________   PHYSICAL EXAM:  VITAL SIGNS: ED Triage Vitals  Enc Vitals Group     BP 11/01/16 1322 (!) 166/80     Pulse Rate 11/01/16 1322  93     Resp 11/01/16 1322 18     Temp 11/01/16 1322 98 F (36.7 C)     Temp Source 11/01/16 1322 Oral     SpO2 11/01/16 1322 100 %     Weight 11/01/16 1323 175 lb (79.4 kg)     Height 11/01/16 1323 5\' 2"  (1.575 m)     Head Circumference --      Peak Flow --      Pain Score 11/01/16 1323 10     Pain Loc --      Pain Edu? --      Excl. in GC? --      Constitutional: Alert and oriented. Well appearing and in no acute  distress. Eyes: Conjunctivae are normal. PERRL. EOMI. Head: Atraumatic. ENT:      Ears:      Nose: No congestion/rhinnorhea.      Mouth/Throat: Mucous membranes are moist.  Neck: No stridor.   Cardiovascular: Normal rate, regular rhythm.  Good peripheral circulation.2+ dorsalis pedis and posterior tibialis pulses. Respiratory: Normal respiratory effort without tachypnea or retractions. Lungs CTAB. Good air entry to the bases with no decreased or absent breath sounds. Gastrointestinal: Bowel sounds 4 quadrants. Soft and nontender to palpation. No guarding or rigidity. No palpable masses. No distention. Musculoskeletal: No gross deformities appreciated. Not able to passively or actively extend, flex, internally or externally rotate right hip. Neurologic:  Normal speech and language. No gross focal neurologic deficits are appreciated.  Skin:  Skin is warm, dry and intact. No rash noted. Psychiatric: Mood and affect are normal. Speech and behavior are normal. Patient exhibits appropriate insight and judgement.   ____________________________________________   LABS (all labs ordered are listed, but only abnormal results are displayed)  Labs Reviewed  CBC - Abnormal; Notable for the following:       Result Value   WBC 12.9 (*)    RDW 14.7 (*)    All other components within normal limits  COMPREHENSIVE METABOLIC PANEL - Abnormal; Notable for the following:    Chloride 98 (*)    BUN 5 (*)    All other components within normal limits  URINALYSIS, COMPLETE (UACMP) WITH MICROSCOPIC - Abnormal; Notable for the following:    Color, Urine COLORLESS (*)    APPearance CLEAR (*)    Specific Gravity, Urine 1.001 (*)    Hgb urine dipstick SMALL (*)    Squamous Epithelial / LPF 0-5 (*)    All other components within normal limits  TSH  URINE DRUG SCREEN, QUALITATIVE ([facility] ONLY)  CORTISOL  CORTISOL-AM, BLOOD  PTH, INTACT AND CALCIUM  VITAMIN D 25 HYDROXY (VIT D DEFICIENCY, FRACTURES)    ____________________________________________  EKG   ____________________________________________  RADIOLOGY Lexine Baton, personally viewed and evaluated these images (plain radiographs) as part of my medical decision making, as well as reviewing the written report by the radiologist. Dg Chest 1 View  Result Date: 11/01/2016 CLINICAL DATA:  Stood up and heard a loud pop, now cannot bear weight EXAM: CHEST 1 VIEW COMPARISON:  Chest x-ray of 12/06/2002 T FINDINGS: No active infiltrate or effusion is seen. Probable minimal scarring is noted peripherally in the right mid lung. Mediastinal and hilar contours are unremarkable. The heart is within normal limits in size. No bony abnormality is seen. IMPRESSION: No definite active process. Question mild scarring peripherally in the right mid lung. Electronically Signed   By: Dwyane Dee M.D.   On: 11/01/2016 14:34   Ct Chest W  Contrast  Result Date: 11/01/2016 CLINICAL DATA:  Chronic cough.  Smoker.  Abnormal chest radiograph EXAM: CT CHEST WITH CONTRAST TECHNIQUE: Multidetector CT imaging of the chest was performed during intravenous contrast administration. CONTRAST:  75mL ISOVUE-300 IOPAMIDOL (ISOVUE-300) INJECTION 61% COMPARISON:  Radiograph 11/01/2016 FINDINGS: Cardiovascular: Coronary artery calcification and aortic atherosclerotic calcification. Mediastinum/Nodes: No axillary or supraclavicular adenopathy. No mediastinal hilar adenopathy. No pericardial fluid. Esophagus normal. Lungs/Pleura: There is patchy ground-glass opacity in the RIGHT upper lobe which corresponds to the plain film abnormality. No measurable nodularity. Lung bases are clear. Airways normal. Upper Abdomen: Limited view of the liver, kidneys, pancreas are unremarkable. Normal adrenal glands. Musculoskeletal: No aggressive osseous lesion. IMPRESSION: 1. Mild RIGHT upper lobe pneumonia. 2. Coronary artery calcification and aortic atherosclerotic calcification. Electronically  Signed   By: Genevive Bi M.D.   On: 11/01/2016 17:53   Mr Hip Right Wo Contrast  Result Date: 11/01/2016 CLINICAL DATA:  Evaluate right femoral neck fracture. EXAM: MR OF THE RIGHT HIP WITHOUT CONTRAST TECHNIQUE: Multiplanar, multisequence MR imaging was performed. No intravenous contrast was administered. COMPARISON:  Radiographs 11/01/2016 FINDINGS: There is a displaced basicervical neck fracture of the right hip. This is not an acute injury. There may be some early/partial healing. No marrow edema or surrounding edema, fluid or hematoma to suggest an acute injury. The left hip is intact. Mild to moderate bilateral hip joint degenerative changes. The pubic symphysis and SI joints are intact. Mild degenerative changes. No pelvic fractures or bone lesions. Normal-appearing sacral nerve roots, surrounded by fat in the neural foramen. No significant intrapelvic abnormalities are demonstrated. The surrounding hip and pelvic musculature appear normal. No muscle tear or myositis. Small inguinal hernias containing fat. IMPRESSION: Remote displaced basicervical neck fracture of the right hip. No acute muscle injury or hematoma. No significant intrapelvic abnormalities. Electronically Signed   By: Rudie Meyer M.D.   On: 11/01/2016 16:51   Dg Hip Unilat W Or Wo Pelvis 2-3 Views Right  Result Date: 11/01/2016 CLINICAL DATA:  Stood up and heard pop, unable to bear weight EXAM: DG HIP (WITH OR WITHOUT PELVIS) 2-3V RIGHT COMPARISON:  None. FINDINGS: There is an angulated acute fracture of the right intertrochanteric femur with varus deformity. The left hip is intact. The pelvic rami are intact. IMPRESSION: Acute angulated right femoral intertrochanteric fracture with varus deformity. Electronically Signed   By: Dwyane Dee M.D.   On: 11/01/2016 14:33    ____________________________________________    PROCEDURES  Procedure(s) performed:     Procedures     ____________________________________________   INITIAL IMPRESSION / ASSESSMENT AND PLAN / ED COURSE  Pertinent labs & imaging results that were available during my care of the patient were reviewed by me and considered in my medical decision making (see chart for details).  Review of the [place] CSRS was performed in accordance of the [facility] prior to dispensing any controlled drugs.     Patient's diagnosis is consistent with right hip fracture without trauma. Vital signs and exam are reassuring. Dr. Ernest Pine was consulted and requested drug screen and MRI of right hip.   At this time chest x-ray, MRI of right hip, basic labs were ordered. I have not given pain medication patient because she takes Suboxone after recovering from narcotic addiction. Patient will be admitted to hospital for pathological fracture workup and hip repair.  Care was transferred to Dr. Elisabeth Pigeon.     ____________________________________________  FINAL CLINICAL IMPRESSION(S) / ED DIAGNOSES  Final diagnoses:  Fall  Fracture  NEW MEDICATIONS STARTED DURING THIS VISIT:  New Prescriptions   No medications on file        This chart was dictated using voice recognition software/Dragon. Despite best efforts to proofread, errors can occur which can change the meaning. Any change was purely unintentional.    Enid DerryAshley Chabely Norby, PA-C 11/01/16 1917    Governor Rooksebecca Lord, MD 11/02/16 [phone]

## 2016-11-01 NOTE — ED Notes (Signed)
Pt arrives to ER via POV with son. Pt assisted from car to wheelchair by son and volunteer.

## 2016-11-01 NOTE — Progress Notes (Signed)
Patient having muscle spasms requesting muscle relaxing. MD paged. Orders forthcoming.

## 2016-11-01 NOTE — Consult Note (Signed)
ORTHOPAEDIC CONSULTATION  PATIENT NAME: [name] DOB: [birth year]  MRN: [id number]  REQUESTING PHYSICIAN: Altamese Dilling, MD  Chief Complaint: Right hip pain  HPI: [name] is a 46 y.o. female who complains of  the acute onset of right hip pain. She apparently had some aching discomfort to the right thigh and hip region with a course of the last several days. She denied any falls, trauma or other aggravating event. She apparently stood up earlier today and felt a "pop" and had the sudden onset of severe right hip pain. She was unable stand or bear weight due to the pain. Radiographs obtained in the Emergency Department demonstrated a displaced right femoral neck fracture.  The patient has a one half year history of steroid use for an apparent skin condition. She states that she was also diagnosed with vitamin D deficiency several years ago and was initially placed on supplements but has not used any supplemental vitamin D for quite some time.  Past Medical History:  Diagnosis Date  . Colitis, ischemic (HCC)   . Fibromyalgia   . GERD (gastroesophageal reflux disease)   . Vitamin D deficiency    Past Surgical History:  Procedure Laterality Date  . ABDOMINAL HYSTERECTOMY    . CARPAL TUNNEL RELEASE Bilateral   . Left thumb CMC interpositional arthroplasty    . TRIGGER FINGER RELEASE     Social History   Social History  . Marital status: Single    Spouse name: N/A  . Number of children: N/A  . Years of education: N/A   Occupational History  . Veterinarian aid    Social History Main Topics  . Smoking status: Former Games developer  . Smokeless tobacco: Never Used  . Alcohol use No  . Drug use: No  . Sexual activity: Not Asked   Other Topics Concern  . None   Social History Narrative  . None   Family History  Problem Relation Age of Onset  . CAD Mother   . CAD Father   . Colon cancer Maternal Grandfather    Allergies  Allergen Reactions  . Sulfa  Antibiotics Rash   Prior to Admission medications   Medication Sig Start Date End Date Taking? Authorizing Provider  citalopram (CELEXA) 40 MG tablet Take 40 mg by mouth daily.   Yes Historical Provider, MD  famotidine (PEPCID) 40 MG tablet Take 40 mg by mouth at bedtime.   Yes Historical Provider, MD  omeprazole (PRILOSEC) 20 MG capsule Take 20 mg by mouth daily.   Yes Historical Provider, MD  SUBOXONE 8-2 MG FILM Place 1 Film under the tongue 2 (two) times daily. 10/31/16  Yes Historical Provider, MD  doxycycline (VIBRAMYCIN) 100 MG capsule Take 1 capsule (100 mg total) by mouth 2 (two) times daily. Patient not taking: Reported on 11/01/2016 09/20/15   Rae Halsted, PA-C  meloxicam (MOBIC) 7.5 MG tablet Take 1 tablet (7.5 mg total) by mouth daily. Patient not taking: Reported on 11/01/2016 09/02/16 09/02/17  Orvil Feil, PA-C  methocarbamol (ROBAXIN-750) 750 MG tablet Take 2 tablets (1,500 mg total) by mouth 4 (four) times daily. Patient not taking: Reported on 11/01/2016 12/23/15   Joni Reining, PA-C   Dg Chest 1 View  Result Date: 11/01/2016 CLINICAL DATA:  Stood up and heard a loud pop, now cannot bear weight EXAM: CHEST 1 VIEW COMPARISON:  Chest x-ray of 12/06/2002 T FINDINGS: No active infiltrate or effusion is seen. Probable minimal scarring is noted peripherally in the  right mid lung. Mediastinal and hilar contours are unremarkable. The heart is within normal limits in size. No bony abnormality is seen. IMPRESSION: No definite active process. Question mild scarring peripherally in the right mid lung. Electronically Signed   By: Dwyane DeePaul  Barry M.D.   On: 11/01/2016 14:34   Ct Chest W Contrast  Result Date: 11/01/2016 CLINICAL DATA:  Chronic cough.  Smoker.  Abnormal chest radiograph EXAM: CT CHEST WITH CONTRAST TECHNIQUE: Multidetector CT imaging of the chest was performed during intravenous contrast administration. CONTRAST:  75mL ISOVUE-300 IOPAMIDOL (ISOVUE-300) INJECTION 61% COMPARISON:   Radiograph 11/01/2016 FINDINGS: Cardiovascular: Coronary artery calcification and aortic atherosclerotic calcification. Mediastinum/Nodes: No axillary or supraclavicular adenopathy. No mediastinal hilar adenopathy. No pericardial fluid. Esophagus normal. Lungs/Pleura: There is patchy ground-glass opacity in the RIGHT upper lobe which corresponds to the plain film abnormality. No measurable nodularity. Lung bases are clear. Airways normal. Upper Abdomen: Limited view of the liver, kidneys, pancreas are unremarkable. Normal adrenal glands. Musculoskeletal: No aggressive osseous lesion. IMPRESSION: 1. Mild RIGHT upper lobe pneumonia. 2. Coronary artery calcification and aortic atherosclerotic calcification. Electronically Signed   By: Genevive BiStewart  Edmunds M.D.   On: 11/01/2016 17:53   Mr Hip Right Wo Contrast  Result Date: 11/01/2016 CLINICAL DATA:  Evaluate right femoral neck fracture. EXAM: MR OF THE RIGHT HIP WITHOUT CONTRAST TECHNIQUE: Multiplanar, multisequence MR imaging was performed. No intravenous contrast was administered. COMPARISON:  Radiographs 11/01/2016 FINDINGS: There is a displaced basicervical neck fracture of the right hip. This is not an acute injury. There may be some early/partial healing. No marrow edema or surrounding edema, fluid or hematoma to suggest an acute injury. The left hip is intact. Mild to moderate bilateral hip joint degenerative changes. The pubic symphysis and SI joints are intact. Mild degenerative changes. No pelvic fractures or bone lesions. Normal-appearing sacral nerve roots, surrounded by fat in the neural foramen. No significant intrapelvic abnormalities are demonstrated. The surrounding hip and pelvic musculature appear normal. No muscle tear or myositis. Small inguinal hernias containing fat. IMPRESSION: Remote displaced basicervical neck fracture of the right hip. No acute muscle injury or hematoma. No significant intrapelvic abnormalities. Electronically Signed   By: Rudie MeyerP.   Gallerani M.D.   On: 11/01/2016 16:51   Dg Hip Unilat W Or Wo Pelvis 2-3 Views Right  Result Date: 11/01/2016 CLINICAL DATA:  Stood up and heard pop, unable to bear weight EXAM: DG HIP (WITH OR WITHOUT PELVIS) 2-3V RIGHT COMPARISON:  None. FINDINGS: There is an angulated acute fracture of the right intertrochanteric femur with varus deformity. The left hip is intact. The pelvic rami are intact. IMPRESSION: Acute angulated right femoral intertrochanteric fracture with varus deformity. Electronically Signed   By: Dwyane DeePaul  Barry M.D.   On: 11/01/2016 14:33    Positive ROS: All other systems have been reviewed and were otherwise negative with the exception of those mentioned in the HPI and as above.  Physical Exam: General: Well developed and well nourished. in no acute distress. HEENT: Atraumatic and normocephalic. Sclera are clear. Extraocular motion is intact. Oropharynx is clear with moist mucosa. Neck: Supple, nontender, good range of motion. No JVD or carotid bruits. Lungs: Clear to auscultation bilaterally. Cardiovascular: Regular rate and rhythm with normal S1 and S2. No murmurs. No gallops or rubs. Pedal pulses are palpable bilaterally. Homans test is negative bilaterally. No significant pretibial or ankle edema. Abdomen: Soft, nontender, and nondistended. Bowel sounds are present. Skin: No lesions in the area of chief complaint Neurologic: Awake, alert, and oriented.  Sensory function is grossly intact. Motor strength is felt to be 5 over 5 bilaterally. No clonus or tremor. Good motor coordination. Lymphatic: No axillary or cervical lymphadenopathy  MUSCULOSKELETAL: The patient's right lower extremity shortened and externally rotated. Pain is elicited with any attempted range of motion of the right hip. No tenderness to palpation about the right knee or ankle.  Assessment: Displaced right femoral neck fracture  Plan: The findings were discussed in detail with the patient and her son. I  recommended right hip hemiarthroplasty for the displaced femoral neck fracture. The usual perioperative course was discussed. The risks and benefits of surgical intervention were reviewed. The patient expressed understanding of the risks and benefits and agreed with plans for surgical intervention.   The surgical site was signed as per the "right site surgery" protocol.   Diagnostic workup is underway as per Medicine for the pathologic fracture of the right femoral neck.  Kimorah Ridolfi P. Angie Fava M.D.

## 2016-11-02 ENCOUNTER — Encounter: Payer: Self-pay | Admitting: *Deleted

## 2016-11-02 ENCOUNTER — Inpatient Hospital Stay: Payer: BLUE CROSS/BLUE SHIELD

## 2016-11-02 ENCOUNTER — Inpatient Hospital Stay: Payer: BLUE CROSS/BLUE SHIELD | Admitting: Anesthesiology

## 2016-11-02 ENCOUNTER — Inpatient Hospital Stay: Admit: 2016-11-02 | Payer: BLUE CROSS/BLUE SHIELD | Admitting: Orthopedic Surgery

## 2016-11-02 ENCOUNTER — Encounter
Admission: EM | Disposition: A | Discharge status: Home Health | Payer: Self-pay | Source: Home / Self Care | Attending: Internal Medicine

## 2016-11-02 HISTORY — PX: HIP ARTHROPLASTY: SHX981

## 2016-11-02 LAB — TYPE AND SCREEN
ABO/RH(D): A POS
ANTIBODY SCREEN: NEGATIVE

## 2016-11-02 LAB — SURGICAL PCR SCREEN
MRSA, PCR: NEGATIVE
Staphylococcus aureus: NEGATIVE

## 2016-11-02 LAB — BASIC METABOLIC PANEL
Anion gap: 8 (ref 5–15)
BUN: 7 mg/dL (ref 6–20)
CO2: 29 mmol/L (ref 22–32)
CREATININE: 0.67 mg/dL (ref 0.44–1.00)
Calcium: 8.9 mg/dL (ref 8.9–10.3)
Chloride: 99 mmol/L — ABNORMAL LOW (ref 101–111)
Glucose, Bld: 110 mg/dL — ABNORMAL HIGH (ref 65–99)
Potassium: 3.4 mmol/L — ABNORMAL LOW (ref 3.5–5.1)
SODIUM: 136 mmol/L (ref 135–145)

## 2016-11-02 LAB — CBC
HCT: 37.9 % (ref 35.0–47.0)
Hemoglobin: 13.2 g/dL (ref 12.0–16.0)
MCH: 30.1 pg (ref 26.0–34.0)
MCHC: 34.8 g/dL (ref 32.0–36.0)
MCV: 86.6 fL (ref 80.0–100.0)
Platelets: 301 10*3/uL (ref 150–440)
RBC: 4.38 MIL/uL (ref 3.80–5.20)
RDW: 14.8 % — AB (ref 11.5–14.5)
WBC: 10 10*3/uL (ref 3.6–11.0)

## 2016-11-02 LAB — GLUCOSE, CAPILLARY: Glucose-Capillary: 127 mg/dL — ABNORMAL HIGH (ref 65–99)

## 2016-11-02 LAB — MAGNESIUM: MAGNESIUM: 2 mg/dL (ref 1.7–2.4)

## 2016-11-02 SURGERY — HEMIARTHROPLASTY, HIP, DIRECT ANTERIOR APPROACH, FOR FRACTURE
Anesthesia: Spinal | Laterality: Right

## 2016-11-02 MED ORDER — LEVOFLOXACIN 500 MG PO TABS
750.0000 mg | ORAL_TABLET | ORAL | Status: DC
  Administered 2016-11-03 – 2016-11-05 (×3): 750 mg via ORAL
  Filled 2016-11-02 (×3): qty 2

## 2016-11-02 MED ORDER — NEOMYCIN-POLYMYXIN B GU 40-200000 IR SOLN
Status: AC
  Filled 2016-11-02: qty 20

## 2016-11-02 MED ORDER — MENTHOL 3 MG MT LOZG
1.0000 | LOZENGE | OROMUCOSAL | Status: DC | PRN
  Filled 2016-11-02: qty 9

## 2016-11-02 MED ORDER — PROPOFOL 500 MG/50ML IV EMUL
INTRAVENOUS | Status: AC
  Filled 2016-11-02: qty 50

## 2016-11-02 MED ORDER — SODIUM CHLORIDE 0.9 % IV SOLN
INTRAVENOUS | Status: DC | PRN
  Administered 2016-11-02: 25 ug/min via INTRAVENOUS

## 2016-11-02 MED ORDER — ACETAMINOPHEN 650 MG RE SUPP: 650.0000 mg | Freq: Four times a day (QID) | RECTAL | Status: DC | PRN

## 2016-11-02 MED ORDER — ENOXAPARIN SODIUM 40 MG/0.4ML ~~LOC~~ SOLN
40.0000 mg | SUBCUTANEOUS | Status: DC
  Administered 2016-11-03 – 2016-11-05 (×3): 40 mg via SUBCUTANEOUS
  Filled 2016-11-02 (×3): qty 0.4

## 2016-11-02 MED ORDER — FENTANYL CITRATE (PF) 100 MCG/2ML IJ SOLN
25.0000 ug | INTRAMUSCULAR | Status: AC | PRN
  Administered 2016-11-02 (×6): 25 ug via INTRAVENOUS

## 2016-11-02 MED ORDER — BISACODYL 10 MG RE SUPP: 10.0000 mg | Freq: Every day | RECTAL | Status: DC | PRN

## 2016-11-02 MED ORDER — FENTANYL CITRATE (PF) 100 MCG/2ML IJ SOLN
INTRAMUSCULAR | Status: AC
  Administered 2016-11-02: 25 ug via INTRAVENOUS
  Filled 2016-11-02: qty 2

## 2016-11-02 MED ORDER — OXYCODONE HCL 5 MG PO TABS
5.0000 mg | ORAL_TABLET | ORAL | Status: DC | PRN
  Administered 2016-11-02 – 2016-11-05 (×11): 10 mg via ORAL
  Filled 2016-11-02 (×13): qty 2

## 2016-11-02 MED ORDER — ONDANSETRON HCL 4 MG/2ML IJ SOLN: 4.0000 mg | Freq: Once | INTRAMUSCULAR | Status: DC | PRN

## 2016-11-02 MED ORDER — HYDROCORTISONE NA SUCCINATE PF 100 MG IJ SOLR
INTRAMUSCULAR | Status: DC | PRN
  Administered 2016-11-02: 100 mg via INTRAVENOUS

## 2016-11-02 MED ORDER — ACETAMINOPHEN 10 MG/ML IV SOLN
INTRAVENOUS | Status: AC
  Filled 2016-11-02: qty 100

## 2016-11-02 MED ORDER — CEFAZOLIN SODIUM-DEXTROSE 2-4 GM/100ML-% IV SOLN
2.0000 g | Freq: Four times a day (QID) | INTRAVENOUS | Status: DC
  Filled 2016-11-02 (×2): qty 100

## 2016-11-02 MED ORDER — ACETAMINOPHEN 10 MG/ML IV SOLN
1000.0000 mg | Freq: Four times a day (QID) | INTRAVENOUS | Status: AC
  Administered 2016-11-02 – 2016-11-03 (×4): 1000 mg via INTRAVENOUS
  Filled 2016-11-02 (×5): qty 100

## 2016-11-02 MED ORDER — MAGNESIUM HYDROXIDE 400 MG/5ML PO SUSP: 30.0000 mL | Freq: Every day | ORAL | Status: DC | PRN

## 2016-11-02 MED ORDER — ACETAMINOPHEN 10 MG/ML IV SOLN
INTRAVENOUS | Status: DC | PRN
  Administered 2016-11-02: 1000 mg via INTRAVENOUS

## 2016-11-02 MED ORDER — ACETAMINOPHEN 325 MG PO TABS: 650.0000 mg | ORAL_TABLET | Freq: Four times a day (QID) | ORAL | Status: DC | PRN

## 2016-11-02 MED ORDER — METOCLOPRAMIDE HCL 5 MG/ML IJ SOLN: 5.0000 mg | Freq: Three times a day (TID) | INTRAMUSCULAR | Status: DC | PRN

## 2016-11-02 MED ORDER — BUPIVACAINE IN DEXTROSE 0.75-8.25 % IT SOLN
INTRATHECAL | Status: DC | PRN
  Administered 2016-11-02: 1.8 mL via INTRATHECAL

## 2016-11-02 MED ORDER — PROPOFOL 10 MG/ML IV BOLUS
INTRAVENOUS | Status: AC
  Filled 2016-11-02: qty 20

## 2016-11-02 MED ORDER — SENNOSIDES-DOCUSATE SODIUM 8.6-50 MG PO TABS
1.0000 | ORAL_TABLET | Freq: Two times a day (BID) | ORAL | Status: DC
  Administered 2016-11-02 – 2016-11-05 (×7): 1 via ORAL
  Filled 2016-11-02 (×7): qty 1

## 2016-11-02 MED ORDER — SODIUM CHLORIDE 0.9 % IV SOLN
INTRAVENOUS | Status: DC
  Administered 2016-11-02 – 2016-11-03 (×2): via INTRAVENOUS

## 2016-11-02 MED ORDER — PREDNISONE 50 MG PO TABS
50.0000 mg | ORAL_TABLET | Freq: Every day | ORAL | Status: DC
  Administered 2016-11-03: 50 mg via ORAL
  Filled 2016-11-02: qty 1

## 2016-11-02 MED ORDER — PHENYLEPHRINE HCL 10 MG/ML IJ SOLN
INTRAMUSCULAR | Status: DC | PRN
  Administered 2016-11-02: 100 ug via INTRAVENOUS

## 2016-11-02 MED ORDER — CITALOPRAM HYDROBROMIDE 20 MG PO TABS
40.0000 mg | ORAL_TABLET | Freq: Every day | ORAL | Status: DC
  Administered 2016-11-02 – 2016-11-05 (×4): 40 mg via ORAL
  Filled 2016-11-02 (×4): qty 2

## 2016-11-02 MED ORDER — METOCLOPRAMIDE HCL 10 MG PO TABS
5.0000 mg | ORAL_TABLET | Freq: Three times a day (TID) | ORAL | Status: DC | PRN
Start: 2016-11-02 — End: 2016-11-05
  Filled 2016-11-02: qty 1

## 2016-11-02 MED ORDER — HYDROCORTISONE NA SUCCINATE PF 100 MG IJ SOLR
INTRAMUSCULAR | Status: AC
  Filled 2016-11-02: qty 2

## 2016-11-02 MED ORDER — LACTATED RINGERS IV SOLN
INTRAVENOUS | Status: DC
  Administered 2016-11-02: 100 mL/h via INTRAVENOUS

## 2016-11-02 MED ORDER — HYDROMORPHONE HCL 1 MG/ML IJ SOLN
0.5000 mg | INTRAMUSCULAR | Status: DC | PRN
  Administered 2016-11-02 – 2016-11-03 (×3): 0.5 mg via INTRAVENOUS
  Filled 2016-11-02 (×3): qty 1

## 2016-11-02 MED ORDER — ONDANSETRON HCL 4 MG PO TABS: 4.0000 mg | ORAL_TABLET | Freq: Four times a day (QID) | ORAL | Status: DC | PRN

## 2016-11-02 MED ORDER — PROPOFOL 10 MG/ML IV BOLUS
INTRAVENOUS | Status: DC | PRN
  Administered 2016-11-02: 20 mg via INTRAVENOUS
  Administered 2016-11-02: 60 mg via INTRAVENOUS
  Administered 2016-11-02: 20 mg via INTRAVENOUS
  Administered 2016-11-02: 40 mg via INTRAVENOUS
  Administered 2016-11-02: 30 mg via INTRAVENOUS
  Administered 2016-11-02: 20 mg via INTRAVENOUS

## 2016-11-02 MED ORDER — MIDAZOLAM HCL 5 MG/5ML IJ SOLN
INTRAMUSCULAR | Status: DC | PRN
  Administered 2016-11-02: 2 mg via INTRAVENOUS

## 2016-11-02 MED ORDER — MIDAZOLAM HCL 2 MG/2ML IJ SOLN
INTRAMUSCULAR | Status: AC
  Filled 2016-11-02: qty 2

## 2016-11-02 MED ORDER — PHENOL 1.4 % MT LIQD
1.0000 | OROMUCOSAL | Status: DC | PRN
  Filled 2016-11-02: qty 177

## 2016-11-02 MED ORDER — TRAMADOL HCL 50 MG PO TABS
50.0000 mg | ORAL_TABLET | ORAL | Status: DC | PRN
  Administered 2016-11-04 – 2016-11-05 (×5): 100 mg via ORAL
  Filled 2016-11-02 (×5): qty 2

## 2016-11-02 MED ORDER — LIDOCAINE HCL (PF) 2 % IJ SOLN
INTRAMUSCULAR | Status: AC
  Filled 2016-11-02: qty 2

## 2016-11-02 MED ORDER — PROPOFOL 500 MG/50ML IV EMUL
INTRAVENOUS | Status: DC | PRN
  Administered 2016-11-02: 55 ug/kg/min via INTRAVENOUS

## 2016-11-02 MED ORDER — FERROUS SULFATE 325 (65 FE) MG PO TABS
325.0000 mg | ORAL_TABLET | Freq: Two times a day (BID) | ORAL | Status: DC
  Administered 2016-11-02 – 2016-11-05 (×6): 325 mg via ORAL
  Filled 2016-11-02 (×6): qty 1

## 2016-11-02 MED ORDER — POTASSIUM CHLORIDE CRYS ER 20 MEQ PO TBCR
40.0000 meq | EXTENDED_RELEASE_TABLET | Freq: Once | ORAL | Status: AC
  Administered 2016-11-02: 40 meq via ORAL
  Filled 2016-11-02: qty 2

## 2016-11-02 MED ORDER — ONDANSETRON HCL 4 MG/2ML IJ SOLN: 4.0000 mg | Freq: Four times a day (QID) | INTRAMUSCULAR | Status: DC | PRN

## 2016-11-02 MED ORDER — PHENYLEPHRINE HCL 10 MG/ML IJ SOLN
INTRAMUSCULAR | Status: AC
  Filled 2016-11-02: qty 1

## 2016-11-02 MED ORDER — FLEET ENEMA 7-19 GM/118ML RE ENEM: 1.0000 | ENEMA | Freq: Once | RECTAL | Status: DC | PRN

## 2016-11-02 MED ORDER — FENTANYL CITRATE (PF) 100 MCG/2ML IJ SOLN
INTRAMUSCULAR | Status: DC | PRN
  Administered 2016-11-02: 50 ug via INTRAVENOUS
  Administered 2016-11-02 (×2): 25 ug via INTRAVENOUS

## 2016-11-02 MED ORDER — METOCLOPRAMIDE HCL 10 MG PO TABS
10.0000 mg | ORAL_TABLET | Freq: Three times a day (TID) | ORAL | Status: AC
  Administered 2016-11-02 – 2016-11-04 (×8): 10 mg via ORAL
  Filled 2016-11-02 (×7): qty 1

## 2016-11-02 MED ORDER — CEFAZOLIN SODIUM-DEXTROSE 2-3 GM-% IV SOLR
2.0000 g | Freq: Four times a day (QID) | INTRAVENOUS | Status: AC
  Administered 2016-11-02 – 2016-11-03 (×3): 2 g via INTRAVENOUS
  Filled 2016-11-02 (×5): qty 50

## 2016-11-02 MED ORDER — FENTANYL CITRATE (PF) 100 MCG/2ML IJ SOLN
INTRAMUSCULAR | Status: AC
  Filled 2016-11-02: qty 2

## 2016-11-02 SURGICAL SUPPLY — 57 items
BAG DECANTER FOR FLEXI CONT (MISCELLANEOUS) ×2 IMPLANT
BLADE SAW 1 (BLADE) ×2 IMPLANT
CANISTER SUCT 1200ML W/VALVE (MISCELLANEOUS) ×2 IMPLANT
CANISTER SUCT 3000ML (MISCELLANEOUS) ×4 IMPLANT
CAPT HIP HEMI 2 ×2 IMPLANT
CATH FOL LEG HOLDER (MISCELLANEOUS) ×2 IMPLANT
DRAPE INCISE IOBAN 66X60 STRL (DRAPES) ×2 IMPLANT
DRAPE SHEET LG 3/4 BI-LAMINATE (DRAPES) ×2 IMPLANT
DRAPE TABLE BACK 80X90 (DRAPES) ×2 IMPLANT
DRSG DERMACEA 8X12 NADH (GAUZE/BANDAGES/DRESSINGS) ×2 IMPLANT
DRSG OPSITE POSTOP 4X10 (GAUZE/BANDAGES/DRESSINGS) ×2 IMPLANT
DRSG OPSITE POSTOP 4X12 (GAUZE/BANDAGES/DRESSINGS) ×2 IMPLANT
DRSG OPSITE POSTOP 4X14 (GAUZE/BANDAGES/DRESSINGS) ×2 IMPLANT
DRSG TEGADERM 4X4.75 (GAUZE/BANDAGES/DRESSINGS) ×2 IMPLANT
DURAPREP 26ML APPLICATOR (WOUND CARE) ×4 IMPLANT
ELECT BLADE 6.5 EXT (BLADE) ×2 IMPLANT
ELECT CAUTERY BLADE 6.4 (BLADE) ×2 IMPLANT
ELECT REM PT RETURN 9FT ADLT (ELECTROSURGICAL) ×2
ELECTRODE REM PT RTRN 9FT ADLT (ELECTROSURGICAL) ×1 IMPLANT
GAUZE PACK 2X3YD (MISCELLANEOUS) ×2 IMPLANT
GLOVE BIO SURGEON STRL SZ8 (GLOVE) ×2 IMPLANT
GLOVE BIOGEL M STRL SZ7.5 (GLOVE) ×4 IMPLANT
GLOVE BIOGEL PI IND STRL 9 (GLOVE) ×1 IMPLANT
GLOVE BIOGEL PI INDICATOR 9 (GLOVE) ×1
GLOVE INDICATOR 8.0 STRL GRN (GLOVE) ×2 IMPLANT
GOWN STRL REUS W/ TWL LRG LVL3 (GOWN DISPOSABLE) ×2 IMPLANT
GOWN STRL REUS W/TWL 2XL LVL3 (GOWN DISPOSABLE) ×2 IMPLANT
GOWN STRL REUS W/TWL LRG LVL3 (GOWN DISPOSABLE) ×2
HANDPIECE INTERPULSE COAX TIP (DISPOSABLE) ×1
HEMOVAC 400CC 10FR (MISCELLANEOUS) ×2 IMPLANT
HOOD PEEL AWAY FLYTE STAYCOOL (MISCELLANEOUS) ×4 IMPLANT
IV NS 100ML SINGLE PACK (IV SOLUTION) ×2 IMPLANT
KIT RM TURNOVER STRD PROC AR (KITS) ×2 IMPLANT
NDL SAFETY 18GX1.5 (NEEDLE) ×2 IMPLANT
NEEDLE FILTER BLUNT 18X 1/2SAF (NEEDLE) ×1
NEEDLE FILTER BLUNT 18X1 1/2 (NEEDLE) ×1 IMPLANT
NS IRRIG 1000ML POUR BTL (IV SOLUTION) ×2 IMPLANT
PACK HIP PROSTHESIS (MISCELLANEOUS) ×2 IMPLANT
PRESSURIZER CEMENT PROX FEM SM (MISCELLANEOUS) ×2 IMPLANT
PRESSURIZER FEM CANAL M (MISCELLANEOUS) ×2 IMPLANT
SET HNDPC FAN SPRY TIP SCT (DISPOSABLE) ×1 IMPLANT
SOL .9 NS 3000ML IRR  AL (IV SOLUTION) ×1
SOL .9 NS 3000ML IRR UROMATIC (IV SOLUTION) ×1 IMPLANT
SOL PREP PVP 2OZ (MISCELLANEOUS) ×2
SOLUTION PREP PVP 2OZ (MISCELLANEOUS) ×1 IMPLANT
SPONGE DRAIN TRACH 4X4 STRL 2S (GAUZE/BANDAGES/DRESSINGS) ×2 IMPLANT
STAPLER SKIN PROX 35W (STAPLE) ×2 IMPLANT
SUT ETHIBOND #5 BRAIDED 30INL (SUTURE) ×2 IMPLANT
SUT VIC AB 0 CT1 36 (SUTURE) ×2 IMPLANT
SUT VIC AB 1 CT1 36 (SUTURE) ×4 IMPLANT
SUT VIC AB 2-0 CT1 27 (SUTURE) ×1
SUT VIC AB 2-0 CT1 TAPERPNT 27 (SUTURE) ×1 IMPLANT
SYR 20CC LL (SYRINGE) ×2 IMPLANT
SYR TB 1ML LUER SLIP (SYRINGE) ×2 IMPLANT
TAPE TRANSPORE STRL 2 31045 (GAUZE/BANDAGES/DRESSINGS) ×2 IMPLANT
TIP COAXIAL FEMORAL CANAL (MISCELLANEOUS) ×2 IMPLANT
TOWER CARTRIDGE SMART MIX (DISPOSABLE) ×2 IMPLANT

## 2016-11-02 NOTE — NC FL2 (Signed)
[place] MEDICAID FL2 LEVEL OF CARE SCREENING TOOL     IDENTIFICATION  Patient Name: [name] Birthdate: [birth year] Sex: female Admission Date (Current Location): 11/01/2016  [place] and [place] Number:  Chiropodist and Address:  [facility], [address] [place], [place] [address]      Provider Number: [id number]  Attending Physician Name and Address:  Shaune Pollack, MD  Relative Name and Phone Number:       Current Level of Care: Hospital Recommended Level of Care: Skilled Nursing Facility Prior Approval Number:    Date Approved/Denied:   PASRR Number:  [id number])  Discharge Plan: SNF    Current Diagnoses: Patient Active Problem List   Diagnosis Date Noted  . Pathological fracture, hip, unsp, init encntr for fracture (HCC) 11/01/2016  . Fibromyalgia 11/01/2016  . Vitamin D deficiency 09/03/2013  . Insomnia 08/11/2013  . Former tobacco use 06/01/2013  . GERD (gastroesophageal reflux disease) 06/01/2013  . Ischemic colitis (HCC) 02/22/2013    Orientation RESPIRATION BLADDER Height & Weight     Self, Time, Situation, Place  Normal Continent Weight: 175 lb (79.4 kg) Height:  5\' 2"  (157.5 cm)  BEHAVIORAL SYMPTOMS/MOOD NEUROLOGICAL BOWEL NUTRITION STATUS   (none)  (none) Continent Diet (NPO for surgery )  AMBULATORY STATUS COMMUNICATION OF NEEDS Skin   Extensive Assist Verbally Surgical wounds (Incision: Right Hip. )                       Personal Care Assistance Level of Assistance  Bathing, Feeding, Dressing Bathing Assistance: Limited assistance Feeding assistance: Independent Dressing Assistance: Limited assistance     Functional Limitations Info  Sight, Hearing, Speech Sight Info: Adequate Hearing Info: Adequate Speech Info: Adequate    SPECIAL CARE FACTORS FREQUENCY  PT (By licensed PT), OT (By licensed OT)     PT Frequency:  (5) OT Frequency:  (5)            Contractures      Additional  Factors Info  Code Status, Allergies Code Status Info:  (Full Code. ) Allergies Info:  (Sulfa Antibiotics. )           Current Medications (11/02/2016):  This is the current hospital active medication list Current Facility-Administered Medications  Medication Dose Route Frequency Provider Last Rate Last Dose  . [MAR Hold] famotidine (PEPCID) tablet 40 mg  40 mg Oral QHS Altamese Dilling, MD   40 mg at 11/01/16 2153  . [MAR Hold] HYDROmorphone (DILAUDID) injection 0.5 mg  0.5 mg Intravenous Q4H PRN Arnaldo Natal, MD   0.5 mg at 11/02/16 0341  . lactated ringers infusion   Intravenous Continuous Rosaria Ferries, MD 100 mL/hr at 11/02/16 0910 100 mL/hr at 11/02/16 0910  . [MAR Hold] methocarbamol (ROBAXIN) tablet 500 mg  500 mg Oral Q8H PRN Oralia Manis, MD   500 mg at 11/02/16 0554  . [MAR Hold] morphine 2 MG/ML injection 2 mg  2 mg Intravenous Q4H PRN Altamese Dilling, MD   2 mg at 11/02/16 0554  . [MAR Hold] oxyCODONE-acetaminophen (PERCOCET/ROXICET) 5-325 MG per tablet 1 tablet  1 tablet Oral Q6H PRN Altamese Dilling, MD   1 tablet at 11/02/16 6962   Facility-Administered Medications Ordered in Other Encounters  Medication Dose Route Frequency Provider Last Rate Last Dose  . acetaminophen (OFIRMEV) IV   Intravenous Anesthesia Intra-op Karoline Caldwell, CRNA   1,000 mg at 11/02/16 1250  . bupivacaine 0.75% in dextrose 8.25% (  intrathecal) (SENSORCAINE) 0.75-8.25 % injection   Intrathecal Anesthesia Intra-op Berdine AddisonMathai Thomas, MD   1.8 mL at 11/02/16 0953  . fentaNYL (SUBLIMAZE) injection    Anesthesia Intra-op Casey Burkitthuy Hoang, CRNA   25 mcg at 11/02/16 1033  . hydrocortisone sodium succinate (SOLU-CORTEF) 100 MG injection    Anesthesia Intra-op Casey Burkitthuy Hoang, CRNA   100 mg at 11/02/16 1013  . midazolam (VERSED) 5 MG/5ML injection    Anesthesia Intra-op Casey Burkitthuy Hoang, CRNA   2 mg at 11/02/16 0944  . phenylephrine (NEO-SYNEPHRINE) 100 mcg/mL in sodium chloride 0.9 % 100 mL infusion    Intravenous Continuous PRN Casey Burkitthuy Hoang, CRNA   Stopped at 11/02/16 1228  . phenylephrine (NEO-SYNEPHRINE) injection   Intravenous Anesthesia Intra-op Casey Burkitthuy Hoang, CRNA   100 mcg at 11/02/16 1002  . propofol (DIPRIVAN) 10 mg/mL bolus/IV push    Anesthesia Intra-op Casey Burkitthuy Hoang, CRNA   30 mg at 11/02/16 1248  . propofol (DIPRIVAN) 500 MG/50ML infusion    Continuous PRN Casey Burkitthuy Hoang, CRNA   Stopped at 11/02/16 1252     Discharge Medications: Please see discharge summary for a list of discharge medications.  Relevant Imaging Results:  Relevant Lab Results:   Additional Information  (SSN: [id number])  Sample, Darleen CrockerBailey M, LCSW

## 2016-11-02 NOTE — Anesthesia Procedure Notes (Signed)
Spinal  Patient location during procedure: OR Staffing Anesthesiologist: Berdine AddisonHOMAS, Jackie Russman Performed: anesthesiologist  Preanesthetic Checklist Completed: patient identified, site marked, surgical consent, pre-op evaluation, timeout performed, IV checked and risks and benefits discussed Spinal Block Patient position: sitting Prep: Betadine Patient monitoring: heart rate, cardiac monitor, continuous pulse ox and blood pressure Approach: midline Location: L3-4 Injection technique: single-shot Needle Needle type: Pencil-Tip  Needle gauge: 25 G Needle length: 9 cm Assessment Sensory level: T10 Additional Notes 0953 marcaine 1.448ml.

## 2016-11-02 NOTE — Clinical Social Work Note (Signed)
Clinical Social Work Assessment  Patient Details  Name: [name] MRN: [id number] Date of Birth: 07-19-1971  Date of referral:  11/02/16               Reason for consult:  Facility Placement                Permission sought to share information with:    Permission granted to share information::     Name::        Agency::     Relationship::     Contact Information:     Housing/Transportation Living arrangements for the past 2 months:  Single Family Home Source of Information:  Parent Patient Interpreter Needed:  None Criminal Activity/Legal Involvement Pertinent to Current Situation/Hospitalization:  No - Comment as needed Significant Relationships:  Adult Children, Parents Lives with:  Parents Do you feel safe going back to the place where you live?  Yes Need for family participation in patient care:  No (Coment)  Care giving concerns:  Patient lives with her mother Elita Quickam in [place].    Social Worker assessment / plan:  Visual merchandiserClinical Social Worker (CSW) reviewed chart and noted that patient is having surgery today for a hip fracture. CSW attempted to meet with patient however she was off the floor for surgery. CSW contacted patient's mother Pam to complete assessment. Per Pam she is 46 y.o and her daughter Gavin PoundDeborah lives with her in [place]. Per Laren EvertsPam Sheneka has 1 son Alycia RossettiRyan who is 48 y.o and works out of state most of the time. Per Pam patient is independent, drives and works full time at [employer] in [place]. Per Pam patient did not fall she felt a pop in her hip while she was at work and has not been able to bear weight since. Pam reported that patient helps her out financially and inquired about short term disability. CSW explained that patient will have to go through her employer for short term disability. Per Pam patient recently got over the flu. Pam reported that patient will likely want to come home after surgery. CSW made Pam aware that patient's insurance is only  in network with SNF facilities in [place] an [place] in [place], which was confirmed by [employer] CSW assistant. Per Pam patient will likely not want to go to a SNF. CSW will continue to follow and assist as needed.   Employment status:  [employer] Insurance information:  Managed Care PT Recommendations:  Not assessed at this time Information / Referral to community resources:  Skilled Nursing Facility  Patient/Family's Response to care:  PT is pending. Per patient's mother she will likely want to go home.   Patient/Family's Understanding of and Emotional Response to Diagnosis, Current Treatment, and Prognosis:  Mother was very pleasant and thanked CSW for calling.   Emotional Assessment Appearance:    Attitude/Demeanor/Rapport:  Unable to Assess Affect (typically observed):  Unable to Assess Orientation:  Oriented to Self, Oriented to Place, Oriented to  Time, Oriented to Situation Alcohol / Substance use:  Not Applicable Psych involvement (Current and /or in the community):  No (Comment)  Discharge Needs  Concerns to be addressed:  Discharge Planning Concerns Readmission within the last 30 days:  No Current discharge risk:  Dependent with Mob[place] Barriers to Discharge:  Continued Medical Work up   [employer], Darleen CrockerBailey M, LCSW 11/02/2016, 1:15 PM

## 2016-11-02 NOTE — Transfer of Care (Signed)
Immediate Anesthesia Transfer of Care Note  Patient: [name]  Procedure(s) Performed: Procedure(s): ARTHROPLASTY BIPOLAR HIP (HEMIARTHROPLASTY) (Right)  Patient Location: PACU  Anesthesia Type:Spinal  Level of Consciousness: awake, alert  and oriented  Airway & Oxygen Therapy: Patient Spontanous Breathing and Patient connected to face mask oxygen  Post-op Assessment: Report given to RN and Post -op Vital signs reviewed and stable  Post vital signs: Reviewed and stable  Last Vitals:  Vitals:   11/02/16 1307 11/02/16 1308  BP: 111/75 111/75  Pulse: 80 77  Resp: 19 11  Temp: 36.6 C 36.6 C    Last Pain:  Vitals:   11/02/16 1308  TempSrc: Temporal  PainSc:       Patients Stated Pain Goal: 1 (11/02/16 0905)  Complications: No apparent anesthesia complications

## 2016-11-02 NOTE — Op Note (Addendum)
OPERATIVE NOTE  DATE OF SURGERY:  11/02/2016  PATIENT NAME:  [name]   DOB: 1971/06/17  MRN: [id number]  PRE-OPERATIVE DIAGNOSIS: Right femoral neck fracture  POST-OPERATIVE DIAGNOSIS:  Same  PROCEDURE:  Right hip hemiarthroplasty (bipolar)  SURGEON:  Jena GaussJames P Jadrian Bulman, Jr. M.D.  ANESTHESIA: spinal  ESTIMATED BLOOD LOSS: 50 mL  FLUIDS REPLACED: 600 mL of crystalloid  DRAINS: None  IMPLANTS UTILIZED: DePuy size 4 Standard Summit femoral stem with Duofix HA,  43 mm OD self-centering bipolar head, 28 mm femoral head with a +1.5 mm neck length  INDICATIONS FOR SURGERY: Jennifer CrazeDeborah L Losito is a 46 y.o. year old female who sustained a displaced right femoral neck fracture. After discussion of the risks and benefits of surgical intervention, the patient expressed understanding of the risks benefits and agree with plans for hip hemiarthroplasty.   The risks, benefits, and alternatives were discussed at length including but not limited to the risks of infection, bleeding, nerve injury, stiffness, blood clots, the need for revision surgery, limb length inequality, dislocation, cardiopulmonary complications, among others, and they were willing to proceed.  PROCEDURE IN DETAIL: The patient was brought into the operating room and, after adequate spinal anesthesia was achieved, patient was placed in a left lateral decubitus position. Axillary roll was placed and all bony prominences were well-padded. The patient's right hip was cleaned and prepped with alcohol and DuraPrep and draped in the usual sterile fashion. A "timeout" was performed as per usual protocol. A lateral curvilinear incision was made gently curving towards the posterior superior iliac spine. The IT band was incised in line with the skin incision and the fibers of the gluteus maximus were split in line. The piriformis tendon was identified, skeletonized, and incised at its insertion to the proximal femur and reflected posteriorly. A T  type posterior capsulotomy was performed. The femoral head was then removed using a corkscrew device. The femoral head was measured using calipers and ring gauges and determined to be 43 mm in diameter.The femoral neck cut was performed using an oscillating saw. The acetabulum was inspected for any bony fragments. The articular surface was in good condition.  Attention was then directed to the proximal femur. A pilot hole for preparation of the proximal femoral canal was created using a high-speed bur. The femoral canal finder was inserted followed by insertion of the conical reamer. Serial broaches were inserted up to a size 4 broach. Calcar region was planed and a trial reduction was performed using a 43 mm OD bipolar hip ball with a +1.5 mm neck length. Good equalization of limb lengths was appreciated and excellent stability was noted both anteriorly and posteriorly. Trial components were removed. The size 4 Standard Summit femoral component with Duofix HA was positioned and impacted into place. T4he Morse taper was cleaned and dried. A 43 mm outer diameter self-centering bipolar hip ball with a 28 m hip ball with a +1.5 mm neck length was placed on the trunnion and impacted into place. The acetabulum was again irrigated and suctioned dry, making sure to inspect for any residal bony debris. The femoral head was then reduced and placed through a range of motion. Excellent stability was noted both anteriorly and posteriorly. Good equalization of limb lengths was appreciated.   The wound was irrigated with copious amounts of normal saline with antibiotic solution and suctioned dry. Good hemostasis was appreciated. The posterior capsulotomy was repaired using #5 Ethibond. Piriformis tendon was reapproximated to the undersurface of the gluteus medius  tendon using #5 Ethibond. Two medium drains were placed in the wound bed and brought out through separate stab incisions to be attached to a Hemovac reservoir. The  IT band was reapproximated using interrupted sutures of #1 Vicryl. Subcutaneous tissue was proximal phalanx using first #0 Vicryl followed by #2-0 Vicryl. The skin was closed with skin staples.  The patient tolerated the procedure well and was transported to the recovery room in stable condition.   Jena Gauss., M.D.

## 2016-11-02 NOTE — Progress Notes (Signed)
[employer] - [place] at [facility]   PATIENT NAME: Burnett KanarisDeborah Newbold    MR#:  [id number]  DATE OF BIRTH:  03/18/1971  SUBJECTIVE:  CHIEF COMPLAINT:   Chief Complaint  Patient presents with  . Hip Pain  . Leg Pain   Left hip pain REVIEW OF SYSTEMS:  Review of Systems  Constitutional: Positive for malaise/fatigue. Negative for chills and fever.  HENT: Negative for congestion.   Eyes: Negative for blurred vision and double vision.  Respiratory: Positive for cough. Negative for hemoptysis, shortness of breath, wheezing and stridor.   Cardiovascular: Negative for chest pain and leg swelling.  Gastrointestinal: Negative for abdominal pain, diarrhea, nausea and vomiting.  Genitourinary: Negative for dysuria and hematuria.  Musculoskeletal: Positive for back pain and joint pain.  Neurological: Negative for dizziness, focal weakness and loss of consciousness.  Psychiatric/Behavioral: Negative for depression. The patient is not nervous/anxious.     DRUG ALLERGIES:   Allergies  Allergen Reactions  . Sulfa Antibiotics Rash   VITALS:  Blood pressure 140/78, pulse 75, temperature 98.3 F (36.8 C), temperature source Axillary, resp. rate 12, height 5\' 2"  (1.575 m), weight 175 lb (79.4 kg), SpO2 92 %. PHYSICAL EXAMINATION:  Physical Exam  Constitutional: She is oriented to person, place, and time and well-developed, well-nourished, and in no distress.  Obese. Moon face.  HENT:  Head: Normocephalic.  Mouth/Throat: Oropharynx is clear and moist.  Eyes: Conjunctivae and EOM are normal.  Neck: Normal range of motion. Neck supple. No JVD present. No tracheal deviation present.  Cardiovascular: Normal rate, regular rhythm and normal heart sounds.  Exam reveals no gallop.   No murmur heard. Pulmonary/Chest: Effort normal and breath sounds normal. No respiratory distress. She has no wheezes. She has no rales.  Abdominal: Soft. Bowel sounds are normal. She exhibits no  distension. There is no tenderness.  Musculoskeletal: Normal range of motion. She exhibits no edema or tenderness.  Neurological: She is alert and oriented to person, place, and time. No cranial nerve deficit.  Skin: No rash noted. No erythema.  Psychiatric: Affect normal.   LABORATORY PANEL:   CBC  Recent Labs Lab 11/02/16 0556  WBC 10.0  HGB 13.2  HCT 37.9  PLT 301   ------------------------------------------------------------------------------------------------------------------ Chemistries   Recent Labs Lab 11/01/16 1442 11/02/16 0556  NA 136 136  K 4.0 3.4*  CL 98* 99*  CO2 30 29  GLUCOSE 98 110*  BUN 5* 7  CREATININE 0.66 0.67  CALCIUM 9.2 8.9  MG  --  2.0  AST 20  --   ALT 19  --   ALKPHOS 71  --   BILITOT 0.3  --    RADIOLOGY:  Ct Chest W Contrast  Result Date: 11/01/2016 CLINICAL DATA:  Chronic cough.  Smoker.  Abnormal chest radiograph EXAM: CT CHEST WITH CONTRAST TECHNIQUE: Multidetector CT imaging of the chest was performed during intravenous contrast administration. CONTRAST:  75mL ISOVUE-300 IOPAMIDOL (ISOVUE-300) INJECTION 61% COMPARISON:  Radiograph 11/01/2016 FINDINGS: Cardiovascular: Coronary artery calcification and aortic atherosclerotic calcification. Mediastinum/Nodes: No axillary or supraclavicular adenopathy. No mediastinal hilar adenopathy. No pericardial fluid. Esophagus normal. Lungs/Pleura: There is patchy ground-glass opacity in the RIGHT upper lobe which corresponds to the plain film abnormality. No measurable nodularity. Lung bases are clear. Airways normal. Upper Abdomen: Limited view of the liver, kidneys, pancreas are unremarkable. Normal adrenal glands. Musculoskeletal: No aggressive osseous lesion. IMPRESSION: 1. Mild RIGHT upper lobe pneumonia. 2. Coronary artery calcification and aortic atherosclerotic calcification. Electronically Signed  By: Genevive Bi M.D.   On: 11/01/2016 17:53   Dg Hip Port Unilat With Pelvis 1v  Right  Result Date: 11/02/2016 CLINICAL DATA:  Right hemiarthroplasty of the hip EXAM: DG HIP (WITH OR WITHOUT PELVIS) 1V PORT RIGHT COMPARISON:  11/01/2016 preoperative radiographs FINDINGS: The uncemented bipolar hip prosthesis noted on the right with overlying subcutaneous emphysema and skin staples. Native left hip is intact. The bony pelvis is unremarkable. No hardware failure or malalignment. No postoperative fracture. IMPRESSION: New uncemented right bipolar hip prosthesis. No postoperative fracture or malalignment. Electronically Signed   By: Tollie Eth M.D.   On: 11/02/2016 14:11   ASSESSMENT AND PLAN:   * Pathological fracture of right hip. S/p ARTHROPLASTY BIPOLAR HIP (HEMIARTHROPLASTY)  Pain control and DVT prophylaxis. PT.    Patient was chronically on oral steroid for 1 year without any clear diagnosis of skin disease. She stopped steroids herself 2 weeks ago. She lost her PCP due to insurance change.   serum cortisol is low, f/u TSH, vitamin D, parathyroid hormone.  I will start prednisone by mouth.  * CAP. CT of chest didn't show any malignancy but show right-sided pneumonia. Start Levaquin, nebulizer and Robitussin when necessary.  * Chronic pain   She is taking Suboxone at home chronically.  * Gastroesophageal reflux disease   Continue famotidine.  I discussed with Dr. Ernest Pine. All the records are reviewed and case discussed with Care Management/Social Worker. Management plans discussed with the patient, family and they are in agreement.  CODE STATUS: Full code  TOTAL TIME TAKING CARE OF THIS PATIENT: 35 minutes.   More than 50% of the time was spent in counseling/coordination of care: YES  POSSIBLE D/C IN 3 DAYS, DEPENDING ON CLINICAL CONDITION.   Shaune Pollack M.D on 11/02/2016 at 4:55 PM  Between 7am to 6pm - Pager - [phone]  After 6pm go to [email or web address] - Scientist, research (life sciences) [facility]  Office  [phone]  CC: Primary  care physician; No PCP Per Patient  Note: This dictation was prepared with Dragon dictation along with smaller phrase technology. Any transcriptional errors that result from this process are unintentional.

## 2016-11-02 NOTE — Anesthesia Post-op Follow-up Note (Cosign Needed)
Anesthesia QCDR form completed.        

## 2016-11-02 NOTE — Brief Op Note (Signed)
11/01/2016 - 11/02/2016  1:07 PM  PATIENT:  [name]  46 y.o. female  PRE-OPERATIVE DIAGNOSIS:  RIGHT FEMORAL NECK FRACTURE  POST-OPERATIVE DIAGNOSIS:  RIGHT FEMORAL NECK FRACTURE  PROCEDURE:  Procedure(s): ARTHROPLASTY BIPOLAR HIP (HEMIARTHROPLASTY) (Right)  SURGEON:  Surgeon(s) and Role:    * Donato HeinzJames P Visente Kirker, MD - Primary  ASSISTANTS: none   ANESTHESIA:   spinal  EBL:  Total I/O In: 600 [I.V.:600] Out: 400 [Urine:350; Blood:50]  BLOOD ADMINISTERED: None  DRAINS: none   LOCAL MEDICATIONS USED:  NONE  SPECIMEN:  Source of Specimen:  Right femoral head  DISPOSITION OF SPECIMEN:  PATHOLOGY  COUNTS:  YES  TOURNIQUET:  * No tourniquets in log *  DICTATION: .Dragon Dictation  PLAN OF CARE: Admit to inpatient   PATIENT DISPOSITION:  PACU - hemodynamically stable.   Delay start of Pharmacological VTE agent (>24hrs) due to surgical blood loss or risk of bleeding: yes

## 2016-11-02 NOTE — Care Management Note (Signed)
Case Management Note  Patient Details  Name: [name] MRN: [id number] Date of Birth: Jun 02, 1971  Subjective/Objective:   POD for right hip arthroplasty. PT pending. Will follow and set up home health if needed. Patient lives at home with her mother, [name]. She works, drive and is normally independent with adls. Following progression. .   I attempted to see patient but she had just return from surgery. Will have weekend case manager follow up.                Action/Plan:   Expected Discharge Date:                  Expected Discharge Plan:     In-House Referral:     Discharge planning Services     Post Acute Care Choice:    Choice offered to:     DME Arranged:    DME Agency:     HH Arranged:    HH Agency:     Status of Service:     If discussed at [employer] Length of Stay Meetings, dates discussed:    Additional Comments:  Jennifer MemosLisa M Shakyra Mattera, RN 11/02/2016, 2:37 PM

## 2016-11-02 NOTE — Anesthesia Preprocedure Evaluation (Signed)
Anesthesia Evaluation  Patient identified by MRN, date of birth, ID band Patient awake    Reviewed: Allergy & Precautions, NPO status , Patient's Chart, lab work & pertinent test results, reviewed documented beta blocker date and time   Airway Mallampati: III  TM Distance: >3 FB     Dental  (+) Chipped   Pulmonary former smoker,           Cardiovascular      Neuro/Psych    GI/Hepatic GERD  Controlled,  Endo/Other    Renal/GU      Musculoskeletal  (+) Fibromyalgia -  Abdominal   Peds  Hematology   Anesthesia Other Findings   Reproductive/Obstetrics                             Anesthesia Physical Anesthesia Plan  ASA: III  Anesthesia Plan: Spinal   Post-op Pain Management:    Induction:   Airway Management Planned:   Additional Equipment:   Intra-op Plan:   Post-operative Plan:   Informed Consent: I have reviewed the patients History and Physical, chart, labs and discussed the procedure including the risks, benefits and alternatives for the proposed anesthesia with the patient or authorized representative who has indicated his/her understanding and acceptance.     Plan Discussed with: CRNA  Anesthesia Plan Comments:         Anesthesia Quick Evaluation

## 2016-11-02 NOTE — Progress Notes (Signed)
Patient complaining of uncontrolled pain and muscle spasms to right hip. MD notified. Orders received to give 0.5mg  dilaudid IV every 4 hours PRN

## 2016-11-03 LAB — PTH, INTACT AND CALCIUM
CALCIUM TOTAL (PTH): 9.4 mg/dL (ref 8.7–10.2)
PTH: 28 pg/mL (ref 15–65)

## 2016-11-03 LAB — BASIC METABOLIC PANEL
ANION GAP: 6 (ref 5–15)
BUN: <5 mg/dL — ABNORMAL LOW (ref 6–20)
CHLORIDE: 102 mmol/L (ref 101–111)
CO2: 29 mmol/L (ref 22–32)
CREATININE: 0.57 mg/dL (ref 0.44–1.00)
Calcium: 8.4 mg/dL — ABNORMAL LOW (ref 8.9–10.3)
GFR calc non Af Amer: >60 mL/min (ref >60)
Glucose, Bld: 125 mg/dL — ABNORMAL HIGH (ref 65–99)
Potassium: 3.6 mmol/L (ref 3.5–5.1)
SODIUM: 137 mmol/L (ref 135–145)

## 2016-11-03 LAB — CBC
HEMATOCRIT: 37.3 % (ref 35.0–47.0)
HEMOGLOBIN: 12.4 g/dL (ref 12.0–16.0)
MCH: 29.6 pg (ref 26.0–34.0)
MCHC: 33.3 g/dL (ref 32.0–36.0)
MCV: 88.8 fL (ref 80.0–100.0)
Platelets: 272 10*3/uL (ref 150–440)
RBC: 4.21 MIL/uL (ref 3.80–5.20)
RDW: 14.8 % — ABNORMAL HIGH (ref 11.5–14.5)
WBC: 10.4 10*3/uL (ref 3.6–11.0)

## 2016-11-03 LAB — STREP PNEUMONIAE URINARY ANTIGEN: STREP PNEUMO URINARY ANTIGEN: NEGATIVE

## 2016-11-03 LAB — VITAMIN D 25 HYDROXY (VIT D DEFICIENCY, FRACTURES): Vit D, 25-Hydroxy: 12.1 ng/mL — ABNORMAL LOW (ref 30.0–100.0)

## 2016-11-03 LAB — GLUCOSE, CAPILLARY: GLUCOSE-CAPILLARY: 184 mg/dL — AB (ref 65–99)

## 2016-11-03 MED ORDER — CALCIUM CARBONATE-VITAMIN D 500-200 MG-UNIT PO TABS
2.0000 | ORAL_TABLET | Freq: Every day | ORAL | Status: DC
  Administered 2016-11-03 – 2016-11-05 (×3): 2 via ORAL
  Filled 2016-11-03 (×3): qty 2

## 2016-11-03 MED ORDER — CALCIUM CITRATE-VITAMIN D 500-400 MG-UNIT PO CHEW
2.0000 | CHEWABLE_TABLET | Freq: Every day | ORAL | Status: DC
  Filled 2016-11-03: qty 2

## 2016-11-03 NOTE — Evaluation (Signed)
Occupational Therapy Evaluation Patient Details Name: [name] MRN: [id number] DOB: 06-24-1971 Today's Date: 11/03/2016    History of Present Illness Patient is a 46 y.o. female admitted on 08 FEB after experiencing 3-4 days of progressive weakness/body aches. Notes moving from sit to stand and hearing pop in R hip. Found to have femoral neck fx. Dr. Marry Guan performed R hip hemiarthroplasty on 09 FEB. PMH includes colitis and GERD.   Clinical Impression   Patient was seen for OT evaluation this date.  Patient lives at home with her mom and was previously independent with all tasks including ADLs, IADLs, driving and working full time.  She now presents with muscle weakness, pain in the right hip, decreased ability to perform transfers/functional mobility and decreased ability to perform self care tasks especially lower body care given posterior hip precautions.  She has no adaptive equipment at home at this time.  Recommend a 3 in one commode for toilet transfers, reacher, sock aid, shoehorn and long handled sponge to increase independence in self care tasks.  She required min assist today from the chair but had increased pain and was groggy at times.  Patient would benefit from skilled OT to maximize safety and independence in daily tasks.  She may require short term rehab prior to returning home to improve independence and given the fact she has hip precautions.     Follow Up Recommendations  SNF    Equipment Recommendations  3 in 1 bedside commode;Other (comment) (will need a hip kit with reacher, sockaid, shoehorn and long sponge.)    Recommendations for Other Services       Precautions / Restrictions Precautions Precautions: Fall;Posterior Hip Restrictions Weight Bearing Restrictions: Yes RLE Weight Bearing: Weight bearing as tolerated      Mobility Bed Mobility Overal bed mobility: Needs Assistance Bed Mobility: Supine to Sit     Supine to sit: Mod assist      General bed mobility comments: Patient requires moderate assistance and verbal cues for sequencing to perform bed mobility and maintain precautions. Requires increased time and cues for breathing.  Transfers Overall transfer level: Needs assistance Equipment used: Rolling walker (2 wheeled) Transfers: Sit to/from Stand Sit to Stand: Min assist         General transfer comment: Patient requires minimal assistance to perform sit to stand and stand to sit transfer with verbal cues to maintain precautions.    Balance Overall balance assessment: Needs assistance Sitting-balance support: Single extremity supported Sitting balance-Leahy Scale: Good     Standing balance support: Bilateral upper extremity supported Standing balance-Leahy Scale: Fair Standing balance comment: Decreased WB on R LE                      Patient educated on the role of OT, instructed on hip precautions and implications into daily self care routine and the need for adaptive equipment to be independent.      ADL Overall ADL's : Needs assistance/impaired Eating/Feeding: Modified independent   Grooming: Sitting;Set up   Upper Body Bathing: Set up   Lower Body Bathing: Maximal assistance Lower Body Bathing Details (indicate cue type and reason): Now has posterior hip precautions and will need to be able to use adaptive equipment or have someone assist her with bathing lower body Upper Body Dressing : Set up   Lower Body Dressing: Maximal assistance Lower Body Dressing Details (indicate cue type and reason): Now has posterior hip precautions and will need to be  able to use adaptive equipment or have someone assist her with dressing lower body Toilet Transfer: Minimal assistance   Toileting- Clothing Manipulation and Hygiene: Minimal assistance       Functional mobility during ADLs: Minimal assistance General ADL Comments: Patient limited by pain this date in right hip and requiring increased  assistance with all tasks, unable to recall hip precautions this date.      Vision     Perception     Praxis      Pertinent Vitals/Pain Pain Assessment: 0-10 Pain Score: 7  Pain Location: R hip Pain Descriptors / Indicators: Aching Pain Intervention(s): Limited activity within patient's tolerance;Repositioned;Monitored during session     Hand Dominance Right   Extremity/Trunk Assessment Upper Extremity Assessment Upper Extremity Assessment: Generalized weakness   Lower Extremity Assessment Lower Extremity Assessment: Defer to PT evaluation RLE Deficits / Details: Decreased AROM all planes, strength 4-/5 globally RLE: Unable to fully assess due to pain       Communication Communication Communication: No difficulties   Cognition Arousal/Alertness: Awake/alert Behavior During Therapy: WFL for tasks assessed/performed Overall Cognitive Status: Within Functional Limits for tasks assessed                 General Comments: Patient groggy at times during evaluation but easily aroused    General Comments       Exercises       Shoulder Instructions      Home Living Family/patient expects to be discharged to:: Private residence Living Arrangements: Parent Available Help at Discharge: Family;Available PRN/intermittently Type of Home: House Home Access: Stairs to enter Entrance Stairs-Number of Steps: 1-2 steps in rear entrance Entrance Stairs-Rails: None Home Layout: One level     Bathroom Shower/Tub: Tub/shower unit Shower/tub characteristics: Curtain Bathroom Toilet: Standard     Home Equipment: None   Additional Comments: Patient does not have any adaptive equipment at home at this time.      Prior Functioning/Environment Level of Independence: Independent        Comments: Patient is a previously independent female who lives with her mother. Patient was independent with all self care and IADL tasks including working full time and driving  independently.        OT Problem List: Decreased strength;Decreased knowledge of precautions;Pain;Decreased range of motion;Decreased activity tolerance;Decreased knowledge of use of DME or AE   OT Treatment/Interventions: Self-care/ADL training;DME and/or AE instruction;Therapeutic activities;Therapeutic exercise;Patient/family education    OT Goals(Current goals can be found in the care plan section) Acute Rehab OT Goals Patient Stated Goal: "to be able to move around and do stuff for myself" OT Goal Formulation: With patient Time For Goal Achievement: 11/17/16 Potential to Achieve Goals: Good  OT Frequency: Min 1X/week   Barriers to D/C:            Co-evaluation              End of Session Equipment Utilized During Treatment: Gait belt;Rolling walker  Activity Tolerance: Patient limited by pain Patient left: in bed;with call bell/phone within reach;with chair alarm set   Time: 1400-1425 OT Time Calculation (min): 25 min Charges:  OT General Charges $OT Visit: 1 Procedure OT Evaluation $OT Eval Low Complexity: 1 Procedure OT Treatments $Self Care/Home Management : 8-22 mins G-Codes:    Lovett,Amy  Amy T Lovett, OTR/L, CLT  11/03/2016, 2:38 PM    

## 2016-11-03 NOTE — Progress Notes (Signed)
Attempted to Dangle patient several times throughout the night, patient not wanting to attempt at these times. Patient educated, repositioned and turn Q2 throughout the night. Pain managed through the night. Continuous pulse ox, TEDs, footpumps in place and pillows under/between legs.

## 2016-11-03 NOTE — Progress Notes (Signed)
Physical Therapy Evaluation Patient Details Name: BODHI STENGLEIN MRN: [id number] DOB: Nov 24, 1970 Today's Date: 11/03/2016   History of Present Illness  Patient is a 46 y.o. female admitted on 08 FEB after experiencing 3-4 days of progressive weakness/body aches. Notes moving from sit to stand and hearing pop in R hip. Found to have femoral neck fx. Dr. Ernest Pine performed R hip hemiarthroplasty on 09 FEB. PMH includes colitis and GERD.  Clinical Impression  Patient is a pleasant female admitted for above listed reasons. Patient was previously independent in all aspects prior to admission to hospital. Upon evaluation, patient demonstrates need for moderate assistance with bed mobility and requires increased time with minimal assistance for transfers and short gait distances. Patient was educated about precautions and was very receptive to maintaining with good safety awareness. Because patient is not at baseline level of function and requires a decent amount of assistance at this time, it is believed that she will benefit from further f/u at a SNF upon discharge when medically appropriate.    Follow Up Recommendations SNF    Equipment Recommendations  Rolling walker with 5" wheels    Recommendations for Other Services       Precautions / Restrictions Precautions Precautions: Fall;Posterior Hip Restrictions Weight Bearing Restrictions: Yes RLE Weight Bearing: Weight bearing as tolerated      Mobility  Bed Mobility Overal bed mobility: Needs Assistance Bed Mobility: Supine to Sit     Supine to sit: Mod assist     General bed mobility comments: Patient requires moderate assistance and verbal cues for sequencing to perform bed mobility and maintain precautions. Requires increased time and cues for breathing.  Transfers Overall transfer level: Needs assistance Equipment used: Rolling walker (2 wheeled) Transfers: Sit to/from Stand Sit to Stand: Min assist         General  transfer comment: Patient requires minimal assistance to perform sit to stand and stand to sit transfer with verbal cues to maintain precautions.  Ambulation/Gait Ambulation/Gait assistance: Min assist Ambulation Distance (Feet): 30 Feet Assistive device: Rolling walker (2 wheeled)       General Gait Details: Patient ambulates with antalgic gait on R LE, demonstrating decreased step length and mild shoulder shrug in weightbearing. Required verbal cues for step through gait pattern. Required increased time with standing rest breaks. Gait training included bed to bathroom and bathroom to chair transfers.  Stairs            Wheelchair Mobility    Modified Rankin (Stroke Patients Only)       Balance Overall balance assessment: Needs assistance Sitting-balance support: Single extremity supported Sitting balance-Leahy Scale: Good     Standing balance support: Bilateral upper extremity supported Standing balance-Leahy Scale: Fair Standing balance comment: Decreased WB on R LE                             Pertinent Vitals/Pain Pain Assessment: 0-10 Pain Score: 8  Pain Location: R hip Pain Descriptors / Indicators: Aching Pain Intervention(s): Limited activity within patient's tolerance;Monitored during session;Repositioned    Home Living Family/patient expects to be discharged to:: Private residence Living Arrangements: Parent Available Help at Discharge: Family;Available PRN/intermittently Type of Home: House Home Access: Stairs to enter Entrance Stairs-Rails: None Entrance Stairs-Number of Steps: 1 Home Layout: One level Home Equipment: None      Prior Function Level of Independence: Independent         Comments: Patient is a previously  independent female who lives with her mother, and is her primary care provider. Patient was working and driving independently.     Hand Dominance        Extremity/Trunk Assessment   Upper Extremity  Assessment Upper Extremity Assessment: Overall WFL for tasks assessed    Lower Extremity Assessment Lower Extremity Assessment: RLE deficits/detail RLE Deficits / Details: Decreased AROM all planes, strength 4-/5 globally RLE: Unable to fully assess due to pain       Communication   Communication: No difficulties  Cognition Arousal/Alertness: Awake/alert Behavior During Therapy: WFL for tasks assessed/performed Overall Cognitive Status: Within Functional Limits for tasks assessed                      General Comments      Exercises     Assessment/Plan    PT Assessment Patient needs continued PT services  PT Problem List Decreased strength;Decreased range of motion;Decreased activity tolerance;Decreased balance;Decreased mobility;Decreased knowledge of use of DME;Pain;Decreased knowledge of precautions          PT Treatment Interventions DME instruction;Gait training;Stair training;Functional mobility training;Therapeutic activities;Therapeutic exercise;Balance training;Patient/family education    PT Goals (Current goals can be found in the Care Plan section)  Acute Rehab PT Goals Patient Stated Goal: "To have less pain" PT Goal Formulation: With patient Time For Goal Achievement: 11/17/16 Potential to Achieve Goals: Good    Frequency BID   Barriers to discharge Decreased caregiver support;Inaccessible home environment      Co-evaluation               End of Session Equipment Utilized During Treatment: Gait belt Activity Tolerance: Patient tolerated treatment well;Patient limited by pain Patient left: in chair;with call bell/phone within reach;with chair alarm set Nurse Communication: Mobility status         Time: 1057-1130 PT Time Calculation (min) (ACUTE ONLY): 33 min   Charges:   PT Evaluation $PT Eval Low Complexity: 1 Procedure PT Treatments $Gait Training: 8-22 mins   PT G Codes:        Neita CarpJulie Ann Oveda Dadamo, PT, DPT 11/03/2016,  12:43 PM

## 2016-11-03 NOTE — Progress Notes (Signed)
   Subjective: 1 Day Post-Op Procedure(s) (LRB): ARTHROPLASTY BIPOLAR HIP (HEMIARTHROPLASTY) (Right) Patient reports pain as severe.  Patient had just received pain medications. Patient is well, and has had no acute complaints or problems Denies any CP, SOB, ABD pain. We will start therapy today.    Objective: Vital signs in last 24 hours: Temp:  [97.8 F (36.6 C)-99 F (37.2 C)] 99 F (37.2 C) (02/10 0737) Pulse Rate:  [7-96] 96 (02/10 0821) Resp:  [11-19] 18 (02/10 0737) BP: (111-155)/(75-91) 133/78 (02/10 0821) SpO2:  [92 %-100 %] 100 % (02/10 0737) FiO2 (%):  [28 %] 28 % (02/09 1541) Weight:  [79.4 kg (175 lb)] 79.4 kg (175 lb) (02/09 0905)  Intake/Output from previous day: 02/09 0701 - 02/10 0700 In: 2593.3 [P.O.:240; I.V.:1953.3; IV Piggyback:400] Out: 2120 [Urine:2070; Blood:50] Intake/Output this shift: No intake/output data recorded.   Recent Labs  11/01/16 1442 11/02/16 0556 11/03/16 0401  HGB 13.1 13.2 12.4    Recent Labs  11/02/16 0556 11/03/16 0401  WBC 10.0 10.4  RBC 4.38 4.21  HCT 37.9 37.3  PLT 301 272    Recent Labs  11/02/16 0556 11/03/16 0401  NA 136 137  K 3.4* 3.6  CL 99* 102  CO2 29 29  BUN 7 <5*  CREATININE 0.67 0.57  GLUCOSE 110* 125*  CALCIUM 8.9 8.4*   No results for input(s): LABPT, INR in the last 72 hours.  EXAM General - Patient is Alert, Appropriate and Oriented Extremity - Neurovascular intact Sensation intact distally Intact pulses distally Dorsiflexion/Plantar flexion intact No cellulitis present Compartment soft Dressing - dressing C/D/I and no drainage Motor Function - intact, moving foot and toes well on exam.   Past Medical History:  Diagnosis Date  . Colitis, ischemic (HCC)   . Fibromyalgia   . GERD (gastroesophageal reflux disease)   . Vitamin D deficiency     Assessment/Plan:   1 Day Post-Op Procedure(s) (LRB): ARTHROPLASTY BIPOLAR HIP (HEMIARTHROPLASTY) (Right) Active Problems:  Pathological fracture, hip, unsp, init encntr for fracture (HCC)  Estimated body mass index is 32.01 kg/m as calculated from the following:   Height as of this encounter: 5\' 2"  (1.575 m).   Weight as of this encounter: 79.4 kg (175 lb). Advance diet Up with therapy  Needs BM Recheck labs in the am CM to assist with discharge  DVT Prophylaxis - Lovenox, Foot Pumps and TED hose Weight-Bearing as tolerated to right leg   T. Cranston Neighborhris Frimy Uffelman, PA-C [facility] 11/03/2016, 8:49 AM

## 2016-11-03 NOTE — Clinical Social Work Note (Signed)
CSW following due to SNF recommendation from PT. The patient's insurance is only in network [place] SNFs in [place], [place]. Bed offers are pending.  Argentina PonderKaren Martha Brandy Kabat, MSW, Theresia MajorsLCSWA [phone]

## 2016-11-03 NOTE — Anesthesia Postprocedure Evaluation (Signed)
Anesthesia Post Note  Patient: Sandford CrazeDeborah L Vandeberg  Procedure(s) Performed: Procedure(s) (LRB): ARTHROPLASTY BIPOLAR HIP (HEMIARTHROPLASTY) (Right)  Patient location during evaluation: Nursing Unit Anesthesia Type: Spinal Level of consciousness: awake, awake and alert and oriented Pain management: pain level controlled Vital Signs Assessment: post-procedure vital signs reviewed and stable Respiratory status: spontaneous breathing, nonlabored ventilation and respiratory function stable Cardiovascular status: stable Postop Assessment: no headache and no backache Anesthetic complications: no     Last Vitals:  Vitals:   11/03/16 0737 11/03/16 0821  BP:  133/78  Pulse: 88 96  Resp: 18   Temp: 37.2 C     Last Pain:  Vitals:   11/03/16 1323  TempSrc:   PainSc: 7                  Shaquasha Gerstel,  Gaytha Raybourn R

## 2016-11-03 NOTE — Progress Notes (Signed)
Physical Therapy Treatment Patient Details Name: [name] MRN: [id number] DOB: 1971-03-02 Today's Date: 11/03/2016    History of Present Illness Patient is a 46 y.o. female admitted on 08 FEB after experiencing 3-4 days of progressive weakness/body aches. Notes moving from sit to stand and hearing pop in R hip. Found to have femoral neck fx. Dr. Ernest Pine performed R hip hemiarthroplasty on 09 FEB. PMH includes colitis and GERD.    PT Comments    Patient demonstrates increased fatigue but improved mobility at this afternoon's session. Patient required minimal assistance with transfers and bed mobility but is still limited by pain. Patient will benefit from progressive dynamic balance/gait training at tomorrow's session to allow for further improvements in function.  Follow Up Recommendations  SNF     Equipment Recommendations  Rolling walker with 5" wheels    Recommendations for Other Services       Precautions / Restrictions Precautions Precautions: Fall;Posterior Hip Precaution Booklet Issued: Yes (comment) Restrictions Weight Bearing Restrictions: Yes RLE Weight Bearing: Weight bearing as tolerated    Mobility  Bed Mobility Overal bed mobility: Needs Assistance Bed Mobility: Sit to Supine     Supine to sit: Mod assist Sit to supine: Min assist   General bed mobility comments: Patient required minimal assistance with R LE to move from sit to supine. Utilized bed rails and L LE to bridge and scoot to [facility].  Transfers Overall transfer level: Needs assistance Equipment used: Rolling walker (2 wheeled) Transfers: Sit to/from Stand Sit to Stand: Min assist         General transfer comment: Patient required minimal assistance to move from sit to stand and stand to sit. Verbal cues to maintain precautions.l  Ambulation/Gait Ambulation/Gait assistance: Min assist Ambulation Distance (Feet): 10 Feet Assistive device: Rolling walker (2 wheeled)       General Gait  Details: Patient ambulates with antalgic gait at decreased cadence with RW. Ambulated from chair to [facility] and from [facility] to bed with tactile cues to drop shoulders and verbal cues to increase WB through R LE.   Stairs            Wheelchair Mobility    Modified Rankin (Stroke Patients Only)       Balance Overall balance assessment: Needs assistance Sitting-balance support: Single extremity supported Sitting balance-Leahy Scale: Good     Standing balance support: Bilateral upper extremity supported Standing balance-Leahy Scale: Fair Standing balance comment: Decreased WB on R LE                    Cognition Arousal/Alertness: Lethargic Behavior During Therapy: WFL for tasks assessed/performed Overall Cognitive Status: Within Functional Limits for tasks assessed                 General Comments: Patient groggy at times during evaluation but easily aroused     Exercises Total Joint Exercises Ankle Circles/Pumps: AROM;20 reps;Strengthening Quad Sets: Strengthening;10 reps Gluteal Sets: Strengthening;10 reps Hip ABduction/ADduction: Strengthening;AAROM;10 reps Long Arc Quad: Strengthening;10 reps    General Comments        Pertinent Vitals/Pain Pain Assessment: 0-10 Pain Score: 7  Pain Location: R hip Pain Descriptors / Indicators: Aching Pain Intervention(s): Limited activity within patient's tolerance;Monitored during session;Repositioned    Home Living Family/patient expects to be discharged to:: Private residence Living Arrangements: Parent Available Help at Discharge: Family;Available PRN/intermittently Type of Home: House Home Access: Stairs to enter Entrance Stairs-Rails: None Home Layout: One level Home Equipment: None Additional Comments:  Patient does not have any adaptive equipment at home at this time.    Prior Function Level of Independence: Independent      Comments: Patient is a previously independent female who lives with her  mother. Patient was independent with all self care and IADL tasks including working full time and driving independently.   PT Goals (current goals can now be found in the care plan section) Acute Rehab PT Goals Patient Stated Goal: "To have less pain" PT Goal Formulation: With patient Time For Goal Achievement: 11/17/16 Potential to Achieve Goals: Good Progress towards PT goals: Progressing toward goals    Frequency    BID      PT Plan Current plan remains appropriate    Co-evaluation             End of Session Equipment Utilized During Treatment: Gait belt Activity Tolerance: Patient tolerated treatment well;Patient limited by fatigue;No increased pain Patient left: in bed;with call bell/phone within reach;with bed alarm set     Time: 1454-1520 PT Time Calculation (min) (ACUTE ONLY): 26 min  Charges:  $Gait Training: 8-22 mins $Therapeutic Exercise: 8-22 mins $Therapeutic Activity: 8-22 mins                    G Codes:      Neita CarpJulie Ann Haifa Hatton, PT, DPT 11/03/2016, 3:26 PM

## 2016-11-03 NOTE — Progress Notes (Signed)
[facility] - [place] at [facility]   PATIENT NAME: [name]    MR#:  [id number]  DATE OF BIRTH:  [birth year]  SUBJECTIVE:  CHIEF COMPLAINT:   Chief Complaint  Patient presents with  . Hip Pain  . Leg Pain   Right hip pain and leg cramp,  REVIEW OF SYSTEMS:  Review of Systems  Constitutional: Positive for malaise/fatigue. Negative for chills and fever.  HENT: Negative for congestion.   Eyes: Negative for blurred vision and double vision.  Respiratory: Negative for cough, hemoptysis, shortness of breath, wheezing and stridor.   Cardiovascular: Negative for chest pain and leg swelling.  Gastrointestinal: Negative for abdominal pain, diarrhea, nausea and vomiting.  Genitourinary: Negative for dysuria and hematuria.  Musculoskeletal: Positive for joint pain. Negative for back pain.  Neurological: Negative for dizziness, focal weakness and loss of consciousness.  Psychiatric/Behavioral: Negative for depression. The patient is not nervous/anxious.     DRUG ALLERGIES:   Allergies  Allergen Reactions  . Sulfa Antibiotics Rash   VITALS:  Blood pressure 133/78, pulse 96, temperature 99 F (37.2 C), temperature source Oral, resp. rate 18, height 5\' 2"  (1.575 m), weight 175 lb (79.4 kg), SpO2 100 %. PHYSICAL EXAMINATION:  Physical Exam  Constitutional: She is oriented to person, place, and time and well-developed, well-nourished, and in no distress.  Obese. Moon face.  HENT:  Head: Normocephalic.  Mouth/Throat: Oropharynx is clear and moist.  Eyes: Conjunctivae and EOM are normal.  Neck: Normal range of motion. Neck supple. No JVD present. No tracheal deviation present.  Cardiovascular: Normal rate, regular rhythm and normal heart sounds.  Exam reveals no gallop.   No murmur heard. Pulmonary/Chest: Effort normal and breath sounds normal. No respiratory distress. She has no wheezes. She has no rales.  Abdominal: Soft. Bowel sounds are normal. She exhibits  no distension. There is no tenderness.  Musculoskeletal: Normal range of motion. She exhibits no edema or tenderness.  Neurological: She is alert and oriented to person, place, and time. No cranial nerve deficit.  Skin: No rash noted. No erythema.  Psychiatric: Affect normal.   LABORATORY PANEL:   CBC  Recent Labs Lab 11/03/16 0401  WBC 10.4  HGB 12.4  HCT 37.3  PLT 272   ------------------------------------------------------------------------------------------------------------------ Chemistries   Recent Labs Lab 11/01/16 1442 11/02/16 0556 11/03/16 0401  NA 136 136 137  K 4.0 3.4* 3.6  CL 98* 99* 102  CO2 30 29 29   GLUCOSE 98 110* 125*  BUN 5* 7 <5*  CREATININE 0.66 0.67 0.57  CALCIUM 9.2  9.4 8.9 8.4*  MG  --  2.0  --   AST 20  --   --   ALT 19  --   --   ALKPHOS 71  --   --   BILITOT 0.3  --   --    RADIOLOGY:  No results found. ASSESSMENT AND PLAN:   * Pathological fracture of right hip. S/p ARTHROPLASTY BIPOLAR HIP (HEMIARTHROPLASTY) POD1. Pain control and DVT prophylaxis. PT.    Patient was chronically on oral steroid for 1 year without any clear diagnosis of skin disease. She stopped steroids herself 2 weeks ago. She lost her PCP due to insurance change.   serum cortisol is low, low sideTSH, low vitamin D, parathyroid hormone.  Started prednisone by mouth. Start vitD plus calcium.  * CAP. CT of chest didn't show any malignancy but show right-sided pneumonia. Started Levaquin, nebulizer and Robitussin when necessary.  * Chronic pain  She is taking Suboxone at home chronically.  * Gastroesophageal reflux disease   Continue famotidine.  All the records are reviewed and case discussed with Care Management/Social Worker. Management plans discussed with the patient, family and they are in agreement.  CODE STATUS: Full code  TOTAL TIME TAKING CARE OF THIS PATIENT: 35 minutes.   More than 50% of the time was spent in counseling/coordination of  care: YES  POSSIBLE D/C to SNF IN 2 DAYS, DEPENDING ON CLINICAL CONDITION.   Shaune Pollackhen, Justine Cossin M.D on 11/03/2016 at 2:13 PM  Between 7am to 6pm - Pager - [phone]  After 6pm go to [email or web address] - Scientist, research (life sciences)password [email or web address]  [facility] [place] [facility]  Office  [phone]  CC: Primary care physician; No PCP Per Patient  Note: This dictation was prepared with Dragon dictation along with smaller phrase technology. Any transcriptional errors that result from this process are unintentional.

## 2016-11-04 LAB — CBC
HEMATOCRIT: 34.8 % — AB (ref 35.0–47.0)
HEMOGLOBIN: 12 g/dL (ref 12.0–16.0)
MCH: 29.9 pg (ref 26.0–34.0)
MCHC: 34.6 g/dL (ref 32.0–36.0)
MCV: 86.4 fL (ref 80.0–100.0)
Platelets: 285 10*3/uL (ref 150–440)
RBC: 4.02 MIL/uL (ref 3.80–5.20)
RDW: 14.7 % — AB (ref 11.5–14.5)
WBC: 13.7 10*3/uL — ABNORMAL HIGH (ref 3.6–11.0)

## 2016-11-04 LAB — BASIC METABOLIC PANEL
Anion gap: 9 (ref 5–15)
BUN: 5 mg/dL — ABNORMAL LOW (ref 6–20)
CALCIUM: 8.8 mg/dL — AB (ref 8.9–10.3)
CHLORIDE: 98 mmol/L — AB (ref 101–111)
CO2: 28 mmol/L (ref 22–32)
CREATININE: 0.6 mg/dL (ref 0.44–1.00)
GFR calc Af Amer: >60 mL/min (ref >60)
GFR calc non Af Amer: >60 mL/min (ref >60)
Glucose, Bld: 115 mg/dL — ABNORMAL HIGH (ref 65–99)
Potassium: 3.7 mmol/L (ref 3.5–5.1)
Sodium: 135 mmol/L (ref 135–145)

## 2016-11-04 LAB — GLUCOSE, CAPILLARY: Glucose-Capillary: 105 mg/dL — ABNORMAL HIGH (ref 65–99)

## 2016-11-04 LAB — PARATHYROID HORMONE, INTACT (NO CA): PTH: 32 pg/mL (ref 15–65)

## 2016-11-04 MED ORDER — ENOXAPARIN SODIUM 40 MG/0.4ML ~~LOC~~ SOLN: 40.0000 mg | SUBCUTANEOUS | 0 refills | Status: DC

## 2016-11-04 MED ORDER — PREDNISONE 20 MG PO TABS
40.0000 mg | ORAL_TABLET | Freq: Every day | ORAL | Status: DC
  Administered 2016-11-04 – 2016-11-05 (×2): 40 mg via ORAL
  Filled 2016-11-04 (×2): qty 2

## 2016-11-04 NOTE — Progress Notes (Signed)
[facility] - [place] at [facility]   PATIENT NAME: Jennifer KanarisDeborah Kennedy    MR#:  [id number]  DATE OF BIRTH:  03/27/1971  SUBJECTIVE:  CHIEF COMPLAINT:   Chief Complaint  Patient presents with  . Hip Pain  . Leg Pain   Right hip pain is better. REVIEW OF SYSTEMS:  Review of Systems  Constitutional: Positive for malaise/fatigue. Negative for chills and fever.  HENT: Negative for congestion.   Eyes: Negative for blurred vision and double vision.  Respiratory: Negative for cough, hemoptysis, shortness of breath, wheezing and stridor.   Cardiovascular: Negative for chest pain and leg swelling.  Gastrointestinal: Negative for abdominal pain, diarrhea, nausea and vomiting.  Genitourinary: Negative for dysuria and hematuria.  Musculoskeletal: Positive for joint pain. Negative for back pain.  Neurological: Negative for dizziness, focal weakness and loss of consciousness.  Psychiatric/Behavioral: Negative for depression. The patient is not nervous/anxious.     DRUG ALLERGIES:   Allergies  Allergen Reactions  . Sulfa Antibiotics Rash   VITALS:  Blood pressure 123/81, pulse (!) 103, temperature 98.5 F (36.9 C), temperature source Oral, resp. rate 18, height 5\' 2"  (1.575 m), weight 175 lb (79.4 kg), SpO2 92 %. PHYSICAL EXAMINATION:  Physical Exam  Constitutional: She is oriented to person, place, and time and well-developed, well-nourished, and in no distress.  Obese. Moon face.  HENT:  Head: Normocephalic.  Mouth/Throat: Oropharynx is clear and moist.  Eyes: Conjunctivae and EOM are normal.  Neck: Normal range of motion. Neck supple. No JVD present. No tracheal deviation present.  Cardiovascular: Normal rate, regular rhythm and normal heart sounds.  Exam reveals no gallop.   No murmur heard. Pulmonary/Chest: Effort normal and breath sounds normal. No respiratory distress. She has no wheezes. She has no rales.  Abdominal: Soft. Bowel sounds are normal. She exhibits  no distension. There is no tenderness.  Musculoskeletal: Normal range of motion. She exhibits no edema or tenderness.  Neurological: She is alert and oriented to person, place, and time. No cranial nerve deficit.  Skin: No rash noted. No erythema.  Psychiatric: Affect normal.   LABORATORY PANEL:   CBC  Recent Labs Lab 11/04/16 0423  WBC 13.7*  HGB 12.0  HCT 34.8*  PLT 285   ------------------------------------------------------------------------------------------------------------------ Chemistries   Recent Labs Lab 11/01/16 1442 11/02/16 0556  11/04/16 0423  NA 136 136  < > 135  K 4.0 3.4*  < > 3.7  CL 98* 99*  < > 98*  CO2 30 29  < > 28  GLUCOSE 98 110*  < > 115*  BUN 5* 7  < > 5*  CREATININE 0.66 0.67  < > 0.60  CALCIUM 9.2  9.4 8.9  < > 8.8*  MG  --  2.0  --   --   AST 20  --   --   --   ALT 19  --   --   --   ALKPHOS 71  --   --   --   BILITOT 0.3  --   --   --   < > = values in this interval not displayed. RADIOLOGY:  No results found. ASSESSMENT AND PLAN:   * Pathological fracture of right hip. S/p ARTHROPLASTY BIPOLAR HIP (HEMIARTHROPLASTY) POD1. Pain control and DVT prophylaxis. PT.    Patient was chronically on oral steroid for 1 year without any clear diagnosis of skin disease. She stopped steroids herself 2 weeks ago. She lost her PCP due to insurance change.  serum cortisol is low, low sideTSH, low vitamin D, parathyroid hormone.  continue prednisone by mouth. Started vitD plus calcium.  * CAP. CT of chest didn't show any malignancy but show right-sided pneumonia. Started Levaquin, nebulizer and Robitussin when necessary.  * Chronic pain   She is taking Suboxone at home chronically.  * Gastroesophageal reflux disease   Continue famotidine.  All the records are reviewed and case discussed with Care Management/Social Worker. Management plans discussed with the patient, family and they are in agreement.  CODE STATUS: Full code  TOTAL  TIME TAKING CARE OF THIS PATIENT: 35 minutes.   More than 50% of the time was spent in counseling/coordination of care: YES  POSSIBLE D/C to SNF IN 1 DAYS, DEPENDING ON CLINICAL CONDITION.   Shaune Pollack M.D on 11/04/2016 at 1:41 PM  Between 7am to 6pm - Pager - [phone]  After 6pm go to [email or web address] - Scientist, research (life sciences) [place] [facility]  Office  [phone]  CC: Primary care physician; No PCP Per Patient  Note: This dictation was prepared with Dragon dictation along with smaller phrase technology. Any transcriptional errors that result from this process are unintentional.

## 2016-11-04 NOTE — Progress Notes (Signed)
Physical Therapy Treatment Patient Details Name: Jennifer CrazeDeborah L Goller MRN: [id number] DOB: December 06, 1970 Today's Date: 11/04/2016    History of Present Illness Patient is a 46 y.o. female admitted on 08 FEB after experiencing 3-4 days of progressive weakness/body aches. Notes moving from sit to stand and hearing pop in R hip. Found to have femoral neck fx. Dr. Ernest PineHooten performed R hip hemiarthroplasty on 09 FEB. PMH includes colitis and GERD.    PT Comments    Pt progressing well this session towards PT goals with improved pain control and less assistance needed for functional mobility.  Pt able to get out of bed with CGA and transfer into standing using  RW with proper hand placement and CGA.  Pt amb with increased gait speed with step-to pattern using a moving RW continuously and increasing ambulation to 60' x2.   Updated discharge recs to HHPT based on progress this date.   Follow Up Recommendations  Home health PT     Equipment Recommendations  Rolling walker with 5" wheels;3in1 (PT)    Recommendations for Other Services       Precautions / Restrictions Precautions Precautions: Fall;Posterior Hip Precaution Comments: verbal cues for reminders Restrictions Weight Bearing Restrictions: Yes RLE Weight Bearing: Weight bearing as tolerated    Mobility  Bed Mobility Overal bed mobility: Needs Assistance       Supine to sit: Min guard     General bed mobility comments: CGA for R LE translation on bed.  Transfers Overall transfer level: Needs assistance Equipment used: Rolling walker (2 wheeled)   Sit to Stand: Min guard         General transfer comment: Pt using proper hand technique without verbal cues, good lift off from bed in low position on first trial.  Ambulation/Gait Ambulation/Gait assistance: Min guard;Supervision Ambulation Distance (Feet): 80 Feet Assistive device: Rolling walker (2 wheeled) Gait Pattern/deviations: Step-to pattern;Antalgic     General Gait  Details: Increased cadence able to advance RW and R LE at the same time for step to gait pattern.  Pt able to amb without rest break or increased pain.   Stairs            Wheelchair Mobility    Modified Rankin (Stroke Patients Only)       Balance Overall balance assessment: Modified Independent                                  Cognition Arousal/Alertness: Awake/alert Behavior During Therapy: WFL for tasks assessed/performed Overall Cognitive Status: Within Functional Limits for tasks assessed                 General Comments: Awake alert this session, following all commands.    Exercises Total Joint Exercises Ankle Circles/Pumps: AROM;20 reps;Strengthening Quad Sets: Strengthening;10 reps Gluteal Sets: Strengthening;10 reps Heel Slides: Both;10 reps;Supine [place] Arc Quad: Strengthening;10 reps Other Exercises Other Exercises: Assisted pt on/off commode with supervision assist.  Pt using wall grab bar for transfers and able to perform peri-care and hand washing with supervision assist.    General Comments        Pertinent Vitals/Pain Pain Assessment: No/denies pain Pain Location: R hip, well controlled throughout session Pain Descriptors / Indicators: Aching Pain Intervention(s): Limited activity within patient's tolerance    Home Living                      Prior Function  PT Goals (current goals can now be found in the care plan section) Acute Rehab PT Goals Patient Stated Goal: "To have less pain" PT Goal Formulation: With patient Time For Goal Achievement: 11/17/16 Potential to Achieve Goals: Good Progress towards PT goals: Progressing toward goals    Frequency    BID      PT Plan Discharge plan needs to be updated    Co-evaluation             End of Session Equipment Utilized During Treatment: Gait belt Activity Tolerance: Patient tolerated treatment well Patient left: in chair;with call  bell/phone within reach     Time: 0940-1018 PT Time Calculation (min) (ACUTE ONLY): 38 min  Charges:  $Gait Training: 8-22 mins $Therapeutic Exercise: 8-22 mins $Therapeutic Activity: 8-22 mins                    G Codes:      Lorin Hauck A Kamren Heskett, PT 08-Nov-2016, 11:19 AM

## 2016-11-04 NOTE — Care Management Note (Signed)
Case Management Note  Patient Details  Name: [name] MRN: [id number] Date of Birth: [birth year]  Subjective/Objective:      Discussed discharge planning with Jennifer [name] who received a right hip fracture repair by Dr Ernest PineHooten on 11/02/16. Dr Ernest PineHooten will provide home health orders for home health services if [name] is discharged to home rather than to rehab. Pharmacy=[facility] on [address] in [place]. Resides at home with her Mother who can provide assistance with ADL at home. [name] reports that her son provides transportation while she is recovering from surgery. [name] has no home equipment and will need a RW and a BSC prior to discharge. [name] chose [facility] to be her home health provider from a list of providers.  Case management will follow for discharge planning.              Action/Plan:   Expected Discharge Date:                  Expected Discharge Plan:     In-House Referral:     Discharge planning Services     Post Acute Care Choice:    Choice offered to:     DME Arranged:    DME Agency:     HH Arranged:    HH Agency:     Status of Service:     If discussed at [employer] Length of Stay Meetings, dates discussed:    Additional Comments:  Hardeep Reetz A, RN 11/04/2016, 9:00 AM

## 2016-11-04 NOTE — Progress Notes (Signed)
   Subjective: 2 Days Post-Op Procedure(s) (LRB): ARTHROPLASTY BIPOLAR HIP (HEMIARTHROPLASTY) (Right) Patient reports pain as moderate.  Patient is well, and has had no acute complaints or problems Denies any CP, SOB, ABD pain. We will continue therapy today.  Diagnosed with PNA yesterday, started on levaquin   Objective: Vital signs in last 24 hours: Temp:  [98.2 F (36.8 C)-99.8 F (37.7 C)] 98.9 F (37.2 C) (02/11 0736) Pulse Rate:  [91-109] 109 (02/11 0736) Resp:  [18] 18 (02/11 0456) BP: (97-118)/(62-79) 97/62 (02/11 0736) SpO2:  [94 %-97 %] 94 % (02/11 0736)  Intake/Output from previous day: 02/10 0701 - 02/11 0700 In: -  Out: 800 [Urine:800] Intake/Output this shift: No intake/output data recorded.   Recent Labs  11/01/16 1442 11/02/16 0556 11/03/16 0401 11/04/16 0423  HGB 13.1 13.2 12.4 12.0    Recent Labs  11/03/16 0401 11/04/16 0423  WBC 10.4 13.7*  RBC 4.21 4.02  HCT 37.3 34.8*  PLT 272 285    Recent Labs  11/03/16 0401 11/04/16 0423  NA 137 135  K 3.6 3.7  CL 102 98*  CO2 29 28  BUN <5* 5*  CREATININE 0.57 0.60  GLUCOSE 125* 115*  CALCIUM 8.4* 8.8*   No results for input(s): LABPT, INR in the last 72 hours.  EXAM General - Patient is Alert, Appropriate and Oriented Extremity - Neurovascular intact Sensation intact distally Intact pulses distally Dorsiflexion/Plantar flexion intact No cellulitis present Compartment soft Dressing - dressing C/D/I and no drainage Motor Function - intact, moving foot and toes well on exam.   Past Medical History:  Diagnosis Date  . Colitis, ischemic (HCC)   . Fibromyalgia   . GERD (gastroesophageal reflux disease)   . Vitamin D deficiency     Assessment/Plan:   2 Days Post-Op Procedure(s) (LRB): ARTHROPLASTY BIPOLAR HIP (HEMIARTHROPLASTY) (Right) Active Problems:   Pathological fracture, hip, unsp, init encntr for fracture (HCC)  Estimated body mass index is 32.01 kg/m as calculated  from the following:   Height as of this encounter: 5\' 2"  (1.575 m).   Weight as of this encounter: 79.4 kg (175 lb). Advance diet Up with therapy  Labs are stable CM to assist with discharge, PT recommending SNF. Plan on discharge to SNF tomorrow. Follow up with [facility] in 6 weeks. SNF to remove staples and apply steri strips on 11/16/16 Continue with TED hose daily, remove at night Lovenox 40 mg sq daily x 14 days  DVT Prophylaxis - Lovenox, Foot Pumps and TED hose Weight-Bearing as tolerated to right leg   T. Cranston Neighborhris Gaines, PA-C [facility] 11/04/2016, 8:43 AM

## 2016-11-05 ENCOUNTER — Encounter: Payer: Self-pay | Admitting: Orthopedic Surgery

## 2016-11-05 LAB — GLUCOSE, CAPILLARY: GLUCOSE-CAPILLARY: 92 mg/dL (ref 65–99)

## 2016-11-05 LAB — VITAMIN D 25 HYDROXY (VIT D DEFICIENCY, FRACTURES): Vit D, 25-Hydroxy: 10.6 ng/mL — ABNORMAL LOW (ref 30.0–100.0)

## 2016-11-05 MED ORDER — CALCIUM CARBONATE-VITAMIN D 500-200 MG-UNIT PO TABS: 2.0000 | ORAL_TABLET | Freq: Every day | ORAL | 2 refills | Status: AC

## 2016-11-05 MED ORDER — PREDNISONE 10 MG PO TABS: ORAL_TABLET | ORAL | 0 refills | Status: DC

## 2016-11-05 MED ORDER — TRAMADOL HCL 50 MG PO TABS: 50.0000 mg | ORAL_TABLET | Freq: Four times a day (QID) | ORAL | 0 refills | Status: DC | PRN

## 2016-11-05 NOTE — Care Management Note (Signed)
Case Management Note  Patient Details  Name: Jennifer CrazeDeborah L Narducci MRN: [id number] Date of Birth: Aug 03, 1971  Subjective/Objective:  TC to [employer] and they walked me through finding a home health agency that would accept patient. [facility] with [facility] [phone]). They can open patient within the next 48 hours. Patient updated and is agreeable to this agency. Provided patient with their contact number. Pharmacy : [facility] [phone]. Called Lovenox 40 mg #14, no refills. Ordered a walker and 3 in 1 from [facility] and this has been delivered.                    [facility] Lovenox called in, DME delivered to room.   Expected Discharge Date:  11/05/16               Expected Discharge Plan:  Home w Home Health Services  In-House Referral:  Clinical Social Work  Discharge planning Services  CM Consult  Post Acute Care Choice:  Durable Medical Equipment Choice offered to:  Patient  DME Arranged:  3-N-1, Walker rolling DME Agency:  [facility]  HH Arranged:  PT HH Agency:   ([facility])  Status of Service:  Completed, signed off  If discussed at Long Length of Stay Meetings, dates discussed:    Additional Comments:  Marily MemosLisa M Remmie Bembenek, RN 11/05/2016, 12:13 PM

## 2016-11-05 NOTE — Progress Notes (Signed)
   Subjective: 3 Days Post-Op Procedure(s) (LRB): ARTHROPLASTY BIPOLAR HIP (HEMIARTHROPLASTY) (Right) Patient reports pain as mild.   Patient is well, and has had no acute complaints or problems Continue with physical  therapy today.  Plan is to go Home after hospital stay. no nausea and no vomiting Patient denies any chest pains or shortness of breath. Objective: Vital signs in last 24 hours: Temp:  [98.5 F (36.9 C)-98.9 F (37.2 C)] 98.6 F (37 C) (02/12 0603) Pulse Rate:  [93-109] 95 (02/12 0603) Resp:  [18] 18 (02/12 0603) BP: (97-133)/(62-81) 133/70 (02/12 0603) SpO2:  [92 %-97 %] 96 % (02/12 0603) well approximated incision Heels are non tender and elevated off the bed using rolled towels Intake/Output from previous day: 02/11 0701 - 02/12 0700 In: 240 [P.O.:240] Out: -  Intake/Output this shift: No intake/output data recorded.   Recent Labs  11/03/16 0401 11/04/16 0423  HGB 12.4 12.0    Recent Labs  11/03/16 0401 11/04/16 0423  WBC 10.4 13.7*  RBC 4.21 4.02  HCT 37.3 34.8*  PLT 272 285    Recent Labs  11/03/16 0401 11/04/16 0423  NA 137 135  K 3.6 3.7  CL 102 98*  CO2 29 28  BUN <5* 5*  CREATININE 0.57 0.60  GLUCOSE 125* 115*  CALCIUM 8.4* 8.8*   No results for input(s): LABPT, INR in the last 72 hours.  EXAM General - Patient is Alert, Appropriate and Oriented Extremity - Neurologically intact Neurovascular intact Sensation intact distally Intact pulses distally Dorsiflexion/Plantar flexion intact No cellulitis present Compartment soft Dressing - dressing C/D/I Motor Function - intact, moving foot and toes well on exam.    Past Medical History:  Diagnosis Date  . Colitis, ischemic (HCC)   . Fibromyalgia   . GERD (gastroesophageal reflux disease)   . Vitamin D deficiency     Assessment/Plan: 3 Days Post-Op Procedure(s) (LRB): ARTHROPLASTY BIPOLAR HIP (HEMIARTHROPLASTY) (Right) Active Problems:   Pathological fracture, hip,  unsp, init encntr for fracture (HCC)  Estimated body mass index is 32.01 kg/m as calculated from the following:   Height as of this encounter: 5\' 2"  (1.575 m).   Weight as of this encounter: 79.4 kg (175 lb). Up with therapy Discharge home with home health today provided she walks around the nurses station and does steps. [facility] Call for appt. Continue TED stocking during the night for 6 weeks lovenox 40 qd for 2 weeks after discharge  Therapist will remove the staples 2 weeks post op Do not get the incision wet until the staples are removed Hip precautions Please give the pt 2 extra honeycomb dressing to take home  Labs: none DVT Prophylaxis - Lovenox, Foot Pumps and TED hose Weight-Bearing as tolerated to right leg D/C O2 and Pulse OX and try on Room [employer]  [facility] 11/05/2016, 6:55 AM

## 2016-11-05 NOTE — Discharge Summary (Signed)
[facility] [place] [facility]   PATIENT NAME: [name]    MR#:  [id number]  DATE OF BIRTH:  [birth year]  DATE OF ADMISSION:  11/01/2016   ADMITTING PHYSICIAN: Altamese Dilling, MD  DATE OF DISCHARGE: 11/05/2016 PRIMARY CARE PHYSICIAN: No PCP Per Patient   ADMISSION DIAGNOSIS:  Fracture [T14.8XXA] SOB (shortness of breath) [R06.02] Fall [W19.XXXA] DISCHARGE DIAGNOSIS:  Active Problems:   Pathological fracture, hip, unsp, init encntr for fracture (HCC)  SECONDARY DIAGNOSIS:   Past Medical History:  Diagnosis Date  . Colitis, ischemic (HCC)   . Fibromyalgia   . GERD (gastroesophageal reflux disease)   . Vitamin D deficiency    HOSPITAL COURSE:  * Pathological fracture of right hip. S/p ARTHROPLASTY BIPOLAR HIP (HEMIARTHROPLASTY) POD1. Pain control and DVT prophylaxis with lovenox for 2 weeks. Home PT.  Patient was chronically on oral steroid for 1 year without any clear diagnosis of skin disease. She stopped steroids herself 2 weeks ago. She lost her PCP due to insurance change. serum cortisol is low, low sideTSH, low vitamin D, parathyroid hormone.  taper prednisone by mouth. continue vitD plus calcium.  * CAP. CT of chest didn't show any malignancy but show right-sided pneumonia. Started Levaquin, nebulizer and Robitussin when necessary. Patient has no cough or shortness breath. Discontinue Levaquin.  * Chronic pain She is taking Suboxone at home chronically.  * Gastroesophageal reflux disease Continue famotidine.  DISCHARGE CONDITIONS:  Stable, discharge to home with PT today.  CONSULTS OBTAINED:  Treatment Team:  Donato Heinz, MD DRUG ALLERGIES:   Allergies  Allergen Reactions  . Sulfa Antibiotics Rash   DISCHARGE MEDICATIONS:   Allergies as of 11/05/2016      Reactions   Sulfa Antibiotics Rash      Medication List    STOP taking these medications   doxycycline 100 MG capsule Commonly known as:   VIBRAMYCIN   meloxicam 7.5 MG tablet Commonly known as:  MOBIC     TAKE these medications   calcium-vitamin D 500-200 MG-UNIT tablet Commonly known as:  OSCAL WITH D Take 2 tablets by mouth daily. Start taking on:  11/06/2016   citalopram 40 MG tablet Commonly known as:  CELEXA Take 40 mg by mouth daily.   enoxaparin 40 MG/0.4ML injection Commonly known as:  LOVENOX Inject 0.4 mLs (40 mg total) into the skin daily.   famotidine 40 MG tablet Commonly known as:  PEPCID Take 40 mg by mouth at bedtime.   methocarbamol 750 MG tablet Commonly known as:  ROBAXIN-750 Take 2 tablets (1,500 mg total) by mouth 4 (four) times daily.   omeprazole 20 MG capsule Commonly known as:  PRILOSEC Take 20 mg by mouth daily.   predniSONE 10 MG tablet Commonly known as:  DELTASONE 40 mg po daily for 2 days, 20 mg po daily for 2 days, 10 mg po daily for 2 days.   SUBOXONE 8-2 MG Film Generic drug:  Buprenorphine HCl-Naloxone HCl Place 1 Film under the tongue 2 (two) times daily.   traMADol 50 MG tablet Commonly known as:  ULTRAM Take 1 tablet (50 mg total) by mouth every 6 (six) hours as needed for moderate pain.            Durable Medical Equipment        Start     Ordered   11/05/16 1407  For home use only DME Shower stool  Once     11/05/16 1407   11/05/16 1201  For home use only DME Walker rolling  Once    Question:  Patient needs a walker to treat with the following condition  Answer:  Weakness   11/05/16 1201       DISCHARGE INSTRUCTIONS:  See AVS.  If you experience worsening of your admission symptoms, develop shortness of breath, life threatening emergency, suicidal or homicidal thoughts you must seek medical attention immediately by calling 911 or calling your MD immediately  if symptoms less severe.  You Must read complete instructions/literature along with all the possible adverse reactions/side effects for all the Medicines you take and that have been prescribed  to you. Take any new Medicines after you have completely understood and accpet all the possible adverse reactions/side effects.   Please note  You were cared for by a hospitalist during your hospital stay. If you have any questions about your discharge medications or the care you received while you were in the hospital after you are discharged, you can call the unit and asked to speak with the hospitalist on call if the hospitalist that took care of you is not available. Once you are discharged, your primary care physician will handle any further medical issues. Please note that NO REFILLS for any discharge medications will be authorized once you are discharged, as it is imperative that you return to your primary care physician (or establish a relationship with a primary care physician if you do not have one) for your aftercare needs so that they can reassess your need for medications and monitor your lab values.    On the day of Discharge:  VITAL SIGNS:  Blood pressure 136/75, pulse 99, temperature 98.5 F (36.9 C), temperature source Oral, resp. rate 16, height 5\' 2"  (1.575 m), weight 175 lb (79.4 kg), SpO2 97 %. PHYSICAL EXAMINATION:  GENERAL:  46 y.o.-year-old patient lying in the bed with no acute distress.  EYES: Pupils equal, round, reactive to light and accommodation. No scleral icterus. Extraocular muscles intact.  HEENT: Head atraumatic, normocephalic. Oropharynx and nasopharynx clear.  NECK:  Supple, no jugular venous distention. No thyroid enlargement, no tenderness.  LUNGS: Normal breath sounds bilaterally, no wheezing, rales,rhonchi or crepitation. No use of accessory muscles of respiration.  CARDIOVASCULAR: S1, S2 normal. No murmurs, rubs, or gallops.  ABDOMEN: Soft, non-tender, non-distended. Bowel sounds present. No organomegaly or mass.  EXTREMITIES: No pedal edema, cyanosis, or clubbing.  NEUROLOGIC: Cranial nerves II through XII are intact. Muscle strength 5/5 in all  extremities. Sensation intact. Gait not checked.  PSYCHIATRIC: The patient is alert and oriented x 3.  SKIN: No obvious rash, lesion, or ulcer.  DATA REVIEW:   CBC  Recent Labs Lab 11/04/16 0423  WBC 13.7*  HGB 12.0  HCT 34.8*  PLT 285    Chemistries   Recent Labs Lab 11/01/16 1442 11/02/16 0556  11/04/16 0423  NA 136 136  < > 135  K 4.0 3.4*  < > 3.7  CL 98* 99*  < > 98*  CO2 30 29  < > 28  GLUCOSE 98 110*  < > 115*  BUN 5* 7  < > 5*  CREATININE 0.66 0.67  < > 0.60  CALCIUM 9.2  9.4 8.9  < > 8.8*  MG  --  2.0  --   --   AST 20  --   --   --   ALT 19  --   --   --   ALKPHOS 71  --   --   --  BILITOT 0.3  --   --   --   < > = values in this interval not displayed.   Microbiology Results  Results for orders placed or performed during the hospital encounter of 11/01/16  Surgical pcr screen     Status: None   Collection Time: 11/02/16  1:54 AM  Result Value Ref Range Status   MRSA, PCR NEGATIVE NEGATIVE Final   Staphylococcus aureus NEGATIVE NEGATIVE Final    Comment:        The Xpert SA Assay (FDA approved for NASAL specimens in patients over 221 years of age), is one component of a comprehensive surveillance program.  Test performance has been validated by [facility] for patients greater than or equal to [address] year old. It is not intended to diagnose infection nor to guide or monitor treatment.     RADIOLOGY:  No results found.   Management plans discussed with the patient, family and they are in agreement.  CODE STATUS:     Code Status Orders        Start     Ordered   11/01/16 2008  Full code  Continuous     11/01/16 2007    Code Status History    Date Active Date Inactive Code Status Order ID Comments User Context   This patient has a current code status but no historical code status.      TOTAL TIME TAKING CARE OF THIS PATIENT: 33 minutes.    Shaune Pollackhen, Brevyn Ring M.D on 11/05/2016 at 2:25 PM  Between 7am to 6pm - Pager -  [phone]  After 6pm go to [email or web address] - Scientist, research (life sciences)password [email or web address]  [facility] [place] [facility]  Office  [phone]  CC: Primary care physician; No PCP Per Patient   Note: This dictation was prepared with Dragon dictation along with smaller phrase technology. Any transcriptional errors that result from this process are unintentional.

## 2016-11-05 NOTE — Progress Notes (Signed)
Physical Therapy Treatment Patient Details Name: Jennifer CrazeDeborah L Kennedy MRN: [id number] DOB: 1971-07-08 Today's Date: 11/05/2016    History of Present Illness Patient is a 46 y.o. female admitted on 08 FEB after experiencing 3-4 days of progressive weakness/body aches. Notes moving from sit to stand and hearing pop in R hip. Found to have femoral neck fx. Dr. Ernest PineHooten performed R hip hemiarthroplasty on 09 FEB. PMH includes colitis and GERD.    PT Comments    Pt agreeable to PT; reports 5/10 pain in right hip/thigh. Pt has questions/concerns regarding position and Right lower extremity position/use with function. Thorough education regarding posterior hip precautions with position, transfers and ambulation; pt notes greater understanding and confidence. Pt progressing exercises, ambulation, transfers and stair climbing with ability to demonstrates safely with supervision to Min guard. All of pt's questions answered to her satisfaction. Pt would benefit from bedside commode for use at night, as pt has walk through tight spaces to bathroom and tub bench for safety in shower; pt educated with demonstration on use with adherence to posterior hip precautions. Pt will have son available to assist getting into her home and as needed. Pt to discharge home today with home health.   Follow Up Recommendations  Home health PT     Equipment Recommendations  Rolling walker with 5" wheels;Other (comment) (BSC, shower bench)    Recommendations for Other Services       Precautions / Restrictions Precautions Precautions: Fall;Posterior Hip Precaution Comments: Pt remembers 3 posterior precautions, but requires education to apple to function. Improved understanding post education  Restrictions Weight Bearing Restrictions: Yes RLE Weight Bearing: Weight bearing as tolerated    Mobility  Bed Mobility               General bed mobility comments: Not tested; up in chair  Transfers Overall transfer level:  Needs assistance Equipment used: Rolling walker (2 wheeled) Transfers: Sit to/from Stand Sit to Stand: Supervision         General transfer comment: From recliner and commode with VCs and education regarding hip precautions and improved use of RLE  Ambulation/Gait Ambulation/Gait assistance: Min guard;Supervision Ambulation Distance (Feet): 275 Feet Assistive device: Rolling walker (2 wheeled) Gait Pattern/deviations: Step-through pattern;Decreased step length - left;Decreased stance time - right;Decreased weight shift to right Gait velocity: decreased Gait velocity interpretation: Below normal speed for age/gender General Gait Details: Much improved fluidity, speed , wt shift and step through pattern with education and instruction regarding pattern, weight bearing status, rw placement and toe in hip precautions with ambulation turns and function   Stairs Stairs: Yes   Stair Management: One rail Right;One rail Left Number of Stairs: 6 General stair comments: Pt has 2 steps or 5 steps without rails. Pt educated on sequence and demonstrates good strength. Pt educated on performing with SPC and 1 person assist on other side (pt will have son available and has SPC) Demonstrate good strength on LLE to perform this way  Wheelchair Mobility    Modified Rankin (Stroke Patients Only)       Balance Overall balance assessment: Needs assistance Sitting-balance support: Feet supported Sitting balance-Leahy Scale: Good Sitting balance - Comments: Educated on R knee lower than hip for adherence to hip precautions    Standing balance support: Bilateral upper extremity supported Standing balance-Leahy Scale: Fair Standing balance comment: Improved understanding of weight bearing status, benefits to healing and less fear  Cognition Arousal/Alertness: Awake/alert Behavior During Therapy: WFL for tasks assessed/performed Overall Cognitive Status: Within Functional  Limits for tasks assessed                      Exercises Total Joint Exercises Hip ABduction/ADduction: Strengthening;Right;10 reps;Standing Straight Leg Raises: Strengthening;Right;10 reps;Standing Marching in Standing: Strengthening;Right;10 reps;Standing (below 70 degrees flexion) Other Exercises Other Exercises: Thorough education on hip precautions with function. Set up for toileting and VCs for transfer Other Exercises: Education regarding activity versus rest and progression/regression Other Exercises: Education regarding use of tub bench    General Comments        Pertinent Vitals/Pain Pain Assessment: 0-10 Pain Score: 5  Pain Location: R hip/thigh Pain Descriptors / Indicators: Aching (alternates between occasional sharp/ versus ache) Pain Intervention(s): Monitored during session;Premedicated before session    Home Living                      Prior Function            PT Goals (current goals can now be found in the care plan section) Progress towards PT goals: Progressing toward goals    Frequency    BID      PT Plan Current plan remains appropriate    Co-evaluation             End of Session Equipment Utilized During Treatment: Gait belt Activity Tolerance: Patient tolerated treatment well (notes improved understanding of areas covered) Patient left: in chair;with call bell/phone within reach;with nursing/sitter in room (refused alarm)     Time: 1610-9604 PT Time Calculation (min) (ACUTE ONLY): 53 min  Charges:  $Gait Training: 23-37 mins $Therapeutic Exercise: 23-37 mins                    G CodesScot Dock, PTA 11/05/2016, 1:12 PM

## 2016-11-05 NOTE — Discharge Instructions (Signed)
HIP HEMIARTHROPLASTY POSTOPERATIVE DIRECTIONS  Hip Rehabilitation, Guidelines Following Surgery  The results of a hip operation are greatly improved after range of motion and muscle strengthening exercises. Follow all safety measures which are given to protect your hip. If any of these exercises cause increased pain or swelling in your joint, decrease the amount until you are comfortable again. Then slowly increase the exercises. Call your caregiver if you have problems or questions.   HOME CARE INSTRUCTIONS  Remove items at home which could result in a fall. This includes throw rugs or furniture in walking pathways.   ICE to the affected hip every three hours for 30 minutes at a time and then as needed for pain and swelling.  Continue to use ice on the hip for pain and swelling from surgery. You may notice swelling that will progress down to the foot and ankle.  This is normal after surgery.  Elevate the leg when you are not up walking on it.    Continue to use the breathing machine which will help keep your temperature down.  It is common for your temperature to cycle up and down following surgery, especially at night when you are not up moving around and exerting yourself.  The breathing machine keeps your lungs expanded and your temperature down.  DIET You may resume your previous home diet once your are discharged from the hospital.  DRESSING / WOUND CARE / SHOWERING You may start showering once staples have been removed. Change dressing as needed. SNF to remove staples and apply steri strips on 11/16/16    ACTIVITY Walk with your walker as instructed. Use walker as long as suggested by your caregivers. Avoid periods of inactivity such as sitting longer than an hour when not asleep. This helps prevent blood clots.  You may resume a sexual relationship in one month or when given the OK by your doctor.  You may return to work once you are cleared by your doctor.  Do not drive a car  for 6 weeks or until released by you surgeon.  Do not drive while taking narcotics.  WEIGHT BEARING Weight bearing as tolerated  POSTOPERATIVE CONSTIPATION PROTOCOL Constipation - defined medically as fewer than three stools per week and severe constipation as less than one stool per week.  One of the most common issues patients have following surgery is constipation.  Even if you have a regular bowel pattern at home, your normal regimen is likely to be disrupted due to multiple reasons following surgery.  Combination of anesthesia, postoperative narcotics, change in appetite and fluid intake all can affect your bowels.  In order to avoid complications following surgery, here are some recommendations in order to help you during your recovery period.  Colace (docusate) - Pick up an over-the-counter form of Colace or another stool softener and take twice a day as long as you are requiring postoperative pain medications.  Take with a full glass of water daily.  If you experience loose stools or diarrhea, hold the colace until you stool forms back up.  If your symptoms do not get better within 1 week or if they get worse, check with your doctor.  Dulcolax (bisacodyl) - Pick up over-the-counter and take as directed by the product packaging as needed to assist with the movement of your bowels.  Take with a full glass of water.  Use this product as needed if not relieved by Colace only.   MiraLax (polyethylene glycol) - Pick up over-the-counter to  have on hand.  MiraLax is a solution that will increase the amount of water in your bowels to assist with bowel movements.  Take as directed and can mix with a glass of water, juice, soda, coffee, or tea.  Take if you go more than two days without a movement. Do not use MiraLax more than once per day. Call your doctor if you are still constipated or irregular after using this medication for 7 days in a row.  If you continue to have problems with postoperative  constipation, please contact the office for further assistance and recommendations.  If you experience "the worst abdominal pain ever" or develop nausea or vomiting, please contact the office immediatly for further recommendations for treatment.  ITCHING  If you experience itching with your medications, try taking only a single pain pill, or even half a pain pill at a time.  You can also use Benadryl over the counter for itching or also to help with sleep.   TED HOSE STOCKINGS Wear the elastic stockings on both legs for six weeks following surgery during the day but you may remove then at night for sleeping.  MEDICATIONS See your medication summary on the After Visit Summary that the nursing staff will review with you prior to discharge.  You may have some home medications which will be placed on hold until you complete the course of blood thinner medication.  It is important for you to complete the blood thinner medication as prescribed by your surgeon.  Continue your approved medications as instructed at time of discharge.  PRECAUTIONS If you experience chest pain or shortness of breath - call 911 immediately for transfer to the hospital emergency department.  If you develop a fever greater that 101 F, purulent drainage from wound, increased redness or drainage from wound, foul odor from the wound/dressing, or calf pain - CONTACT YOUR SURGEON.                                                   FOLLOW-UP APPOINTMENTS Make sure you keep all of your appointments after your operation with your surgeon and caregivers. You should call the office at the above phone number and make an appointment for approximately two weeks after the date of your surgery or on the date instructed by your surgeon outlined in the "After Visit Summary".  RANGE OF MOTION AND STRENGTHENING EXERCISES  These exercises are designed to help you keep full movement of your hip joint. Follow your caregiver's or physical  therapist's instructions. Perform all exercises about fifteen times, three times per day or as directed. Exercise both hips, even if you have had only one joint replacement. These exercises can be done on a training (exercise) mat, on the floor, on a table or on a bed. Use whatever works the best and is most comfortable for you. Use music or television while you are exercising so that the exercises are a pleasant break in your day. This will make your life better with the exercises acting as a break in routine you can look forward to.  Lying on your back, slowly slide your foot toward your buttocks, raising your knee up off the floor. Then slowly slide your foot back down until your leg is straight again.  Lying on your back spread your legs as far apart as you can  without causing discomfort.  Lying on your side, raise your upper leg and foot straight up from the floor as far as is comfortable. Slowly lower the leg and repeat.  Lying on your back, tighten up the muscle in the front of your thigh (quadriceps muscles). You can do this by keeping your leg straight and trying to raise your heel off the floor. This helps strengthen the largest muscle supporting your knee.  Lying on your back, tighten up the muscles of your buttocks both with the legs straight and with the knee bent at a comfortable angle while keeping your heel on the floor.      IF YOU ARE TRANSFERRED TO A SKILLED REHAB FACILITY If the patient is transferred to a skilled rehab facility following release from the hospital, a list of the current medications will be sent to the facility for the patient to continue.  When discharged from the skilled rehab facility, please have the facility set up the patient's Home Health Physical Therapy prior to being released. Also, the skilled facility will be responsible for providing the patient with their medications at time of release from the facility to include their pain medication, the muscle  relaxants, and their blood thinner medication. If the patient is still at the rehab facility at time of the two week follow up appointment, the skilled rehab facility will also need to assist the patient in arranging follow up appointment in our office and any transportation needs.  MAKE SURE YOU:  Understand these instructions.  Get help right away if you are not doing well or get worse.    Pick up stool softner and laxative for home use following surgery while on pain medications. Continue to use ice for pain and swelling after surgery. Do not use any lotions or creams on the incision until instructed by your surgeon. Heart healthy diet. Fall precaution. HHPT.

## 2016-11-05 NOTE — Progress Notes (Signed)
Pt given discharge instructions, prescriptions, bedside commode and walker. Pt verbalized understanding. Pt wheeled to car by staff.

## 2016-11-06 LAB — SURGICAL PATHOLOGY

## 2017-02-03 DIAGNOSIS — Z96649 Presence of unspecified artificial hip joint: Secondary | ICD-10-CM | POA: Insufficient documentation

## 2017-03-06 DIAGNOSIS — M79672 Pain in left foot: Secondary | ICD-10-CM | POA: Insufficient documentation

## 2017-03-06 DIAGNOSIS — S86312S Strain of muscle(s) and tendon(s) of peroneal muscle group at lower leg level, left leg, sequela: Secondary | ICD-10-CM | POA: Insufficient documentation

## 2017-07-18 DIAGNOSIS — M25512 Pain in left shoulder: Secondary | ICD-10-CM

## 2017-07-18 DIAGNOSIS — R700 Elevated erythrocyte sedimentation rate: Secondary | ICD-10-CM | POA: Insufficient documentation

## 2017-07-18 DIAGNOSIS — M25511 Pain in right shoulder: Secondary | ICD-10-CM | POA: Insufficient documentation

## 2017-07-18 DIAGNOSIS — M2550 Pain in unspecified joint: Secondary | ICD-10-CM | POA: Insufficient documentation

## 2018-01-08 ENCOUNTER — Other Ambulatory Visit: Payer: Self-pay | Admitting: Internal Medicine

## 2018-01-08 DIAGNOSIS — R1011 Right upper quadrant pain: Secondary | ICD-10-CM

## 2018-01-13 ENCOUNTER — Ambulatory Visit: Payer: BLUE CROSS/BLUE SHIELD | Attending: Internal Medicine

## 2018-02-05 ENCOUNTER — Ambulatory Visit
Admission: RE | Admit: 2018-02-05 | Discharge: 2018-02-05 | Disposition: A | Discharge status: Home / Self Care | Payer: BLUE CROSS/BLUE SHIELD | Source: Ambulatory Visit | Attending: Internal Medicine | Admitting: Internal Medicine

## 2018-02-05 DIAGNOSIS — R1011 Right upper quadrant pain: Secondary | ICD-10-CM | POA: Diagnosis present

## 2018-02-21 ENCOUNTER — Other Ambulatory Visit: Payer: Self-pay | Admitting: Internal Medicine

## 2018-02-21 DIAGNOSIS — Z1231 Encounter for screening mammogram for malignant neoplasm of breast: Secondary | ICD-10-CM

## 2018-03-09 ENCOUNTER — Ambulatory Visit (INDEPENDENT_AMBULATORY_CARE_PROVIDER_SITE_OTHER): Payer: BLUE CROSS/BLUE SHIELD

## 2018-03-09 ENCOUNTER — Ambulatory Visit
Admission: EM | Admit: 2018-03-09 | Discharge: 2018-03-09 | Disposition: A | Discharge status: Home / Self Care | Payer: BLUE CROSS/BLUE SHIELD

## 2018-03-09 ENCOUNTER — Other Ambulatory Visit: Payer: Self-pay

## 2018-03-09 DIAGNOSIS — S7001XA Contusion of right hip, initial encounter: Secondary | ICD-10-CM

## 2018-03-09 DIAGNOSIS — W010XXA Fall on same level from slipping, tripping and stumbling without subsequent striking against object, initial encounter: Secondary | ICD-10-CM

## 2018-03-09 DIAGNOSIS — S40022A Contusion of left upper arm, initial encounter: Secondary | ICD-10-CM

## 2018-03-09 DIAGNOSIS — M79602 Pain in left arm: Secondary | ICD-10-CM

## 2018-03-09 DIAGNOSIS — M25512 Pain in left shoulder: Secondary | ICD-10-CM | POA: Diagnosis not present

## 2018-03-09 DIAGNOSIS — M25551 Pain in right hip: Secondary | ICD-10-CM | POA: Diagnosis not present

## 2018-03-09 NOTE — ED Provider Notes (Signed)
[facility] ____________________________________________  Time seen: Approximately 2:48 PM  I have reviewed the triage vital signs and the nursing notes.   HISTORY  Chief Complaint Fall   HPI [name] is a 47 y.o. female presenting for evaluation of left arm and right hip pain post injury that occurred Friday evening.  Patient states that she was going into the house, tripped over a very small step due to carrying bags her hands, and states that she fell directly to left forearm and as she twisted also hit right hip.  Denies head injury or loss of consciousness.  States main reason that she is here today is that she wanted to make sure she has no fracture.  States that she has previously had a nontraumatic right hip fracture with right hip replacement post chronic steroid use.  Patient states pain is currently mild.  Denies pain radiation, paresthesias or decreased range of motion.  No alleviating measures attempted prior to arrival.  Continues remain ambulatory.  Reports otherwise feels well.  States fell only due to tripping. Denies chest pain, shortness of breath, abdominal pain, or rash. Denies recent sickness. Denies recent antibiotic use.     Past Medical History:  Diagnosis Date  . Colitis, ischemic (HCC)   . Fibromyalgia   . GERD (gastroesophageal reflux disease)   . Vitamin D deficiency     Patient Active Problem List   Diagnosis Date Noted  . Pathological fracture, hip, unsp, init encntr for fracture (HCC) 11/01/2016  . Fibromyalgia 11/01/2016  . Vitamin D deficiency 09/03/2013  . Insomnia 08/11/2013  . Former tobacco use 06/01/2013  . GERD (gastroesophageal reflux disease) 06/01/2013  . Ischemic colitis (HCC) 02/22/2013    Past Surgical History:  Procedure Laterality Date  . ABDOMINAL HYSTERECTOMY    . CARPAL TUNNEL RELEASE Bilateral   . HIP ARTHROPLASTY Right 11/02/2016   Procedure: ARTHROPLASTY BIPOLAR HIP (HEMIARTHROPLASTY);  Surgeon: Donato Heinz, MD;  Location: [facility] ORS;  Service: Orthopedics;  Laterality: Right;  . Left thumb [facility] interpositional arthroplasty    . TRIGGER FINGER RELEASE       No current facility-administered medications for this encounter.   Current Outpatient Medications:  .  calcium-vitamin D (OSCAL WITH D) 500-200 MG-UNIT tablet, Take 2 tablets by mouth daily., Disp: 30 tablet, Rfl: 2 .  citalopram (CELEXA) 40 MG tablet, Take 40 mg by mouth daily., Disp: , Rfl:  .  enoxaparin (LOVENOX) 40 MG/0.4ML injection, Inject 0.4 mLs (40 mg total) into the skin daily., Disp: 14 Syringe, Rfl: 0 .  gabapentin (NEURONTIN) 300 MG capsule, Take 300 mg by mouth 3 (three) times daily., Disp: , Rfl: 11 .  hyoscyamine (LEVSIN SL) 0.125 MG SL tablet, Place under the tongue., Disp: , Rfl:  .  omeprazole (PRILOSEC) 20 MG capsule, Take 20 mg by mouth daily., Disp: , Rfl:  .  SUBOXONE 8-2 MG FILM, Place 1 Film under the tongue 2 (two) times daily., Disp: , Rfl: 0  Allergies Sulfa antibiotics  Family History  Problem Relation Age of Onset  . CAD Mother   . CAD Father   . Colon cancer Maternal Grandfather     Social History Social History   Tobacco Use  . Smoking status: Former Smoker    Packs/day: 1.00    Years: 25.00    Pack years: 25.00    Last attempt to quit: 09/23/2013    Years since quitting: 4.4  . Smokeless tobacco: Never Used  Substance Use Topics  .  Alcohol use: No  . Drug use: No    Review of Systems Constitutional: No fever/chills Cardiovascular: Denies chest pain. Respiratory: Denies shortness of breath. Gastrointestinal: No abdominal pain.   Musculoskeletal: Negative for back pain. AS above.  Skin: Negative for rash.  ____________________________________________   PHYSICAL EXAM:  VITAL SIGNS: ED Triage Vitals  Enc Vitals Group     BP 03/09/18 1332 (!) 155/85     Pulse Rate 03/09/18 1332 86     Resp 03/09/18 1332 16     Temp 03/09/18 1332 98.7 F (37.1 C)     Temp Source 03/09/18  1332 Oral     SpO2 03/09/18 1332 100 %     Weight 03/09/18 1330 152 lb (68.9 kg)     Height 03/09/18 1330 5\' 1"  (1.549 m)     Head Circumference --      Peak Flow --      Pain Score 03/09/18 1330 2     Pain Loc --      Pain Edu? --      Excl. in GC? --     Constitutional: Alert and oriented. Well appearing and in no acute distress. ENT      Head: Normocephalic and atraumatic. Cardiovascular: Normal rate, regular rhythm. Grossly normal heart sounds.  Good peripheral circulation. Respiratory: Normal respiratory effort without tachypnea nor retractions. Breath sounds are clear and equal bilaterally. No wheezes, rales, rhonchi. Musculoskeletal:  No midline cervical, thoracic or lumbar tenderness to palpation. Bilateral distal radial pulses equal and easily palpated.  Changes positions quickly in room, ambulatory with steady gait.   Except: Left proximal forearm mild to moderate diffuse tenderness to palpation, no swelling, no ecchymosis, skin intact.  Left proximal humerus and anterior AC joint mild tenderness to direct palpation, full range of motion present, able to abduct greater than 90 degrees, no ecchymosis.  Bilateral hand grip strong and equal. Except: Right lateral greater trochanter mild tenderness to direct palpation, no swelling, no ecchymosis, skin intact, surgical scar present, able to abduct and adduct, steady gait, right lower extremity otherwise nontender. Neurologic:  Normal speech and language. No gross focal neurologic deficits are appreciated. Speech is normal. No gait instability.  Skin:  Skin is warm, dry and intact. No rash noted. Psychiatric: Mood and affect are normal. Speech and behavior are normal. Patient exhibits appropriate insight and judgment   ___________________________________________   LABS (all labs ordered are listed, but only abnormal results are displayed)  Labs Reviewed - No data to  display ____________________________________________  RADIOLOGY  Dg Forearm Left  Result Date: 03/09/2018 CLINICAL DATA:  Fall on Friday evening, LEFT arm pain. EXAM: LEFT FOREARM - 2 VIEW COMPARISON:  None. FINDINGS: Radius and ulna are normally aligned. No fracture line or displaced fracture fragment. Soft tissues about the forearm are unremarkable. IMPRESSION: Negative. Electronically Signed   By: Bary RichardStan  Maynard M.D.   On: 03/09/2018 14:53   Dg Humerus Left  Result Date: 03/09/2018 CLINICAL DATA:  Fall on Friday, LEFT arm pain. EXAM: LEFT HUMERUS - 2+ VIEW COMPARISON:  None. FINDINGS: LEFT humerus is normally aligned. No fracture line or displaced fracture fragment seen. No appreciable degenerative change at the LEFT shoulder or LEFT elbow. Adjacent soft tissues are unremarkable. IMPRESSION: Negative. Electronically Signed   By: Bary RichardStan  Maynard M.D.   On: 03/09/2018 14:53   Dg Hip Unilat W Or Wo Pelvis 2-3 Views Right  Result Date: 03/09/2018 CLINICAL DATA:  Fall on Friday landing on RIGHT hip. EXAM: DG HIP (  WITH OR WITHOUT PELVIS) 2-3V RIGHT COMPARISON:  None. FINDINGS: Single view of the pelvis and two views of the RIGHT hip are provided. RIGHT hip arthroplasty hardware appears intact and appropriately positioned. Visualized osseous structures appear intact and normally aligned. No fracture line or displaced fracture fragment seen. Soft tissues about the pelvis and RIGHT hip are unremarkable. IMPRESSION: No acute findings. No osseous fracture or dislocation. RIGHT hip arthroplasty hardware appears appropriately positioned. Electronically Signed   By: Bary Richard M.D.   On: 03/09/2018 14:55   ____________________________________________   PROCEDURES Procedures   INITIAL IMPRESSION / ASSESSMENT AND PLAN / ED COURSE  Pertinent labs & imaging results that were available during my care of the patient were reviewed by me and considered in my medical decision making (see chart for  details).  Well-appearing patient.  No acute distress.  Presenting for evaluation of pain post mechanical injury that occurred night before last.  Will evaluate left forearm, left humerus and right hip x-rays.  Forearm, humerus and hip x-rays reviewed, results as above per radiologist, no acute findings.  Suspect contusion injuries.  Encourage supportive care, stretching, ice and close monitoring.  Discussed follow up with Primary care physician this week as needed. Discussed follow up and return parameters including no resolution or any worsening concerns. Patient verbalized understanding and agreed to plan.   ____________________________________________   FINAL CLINICAL IMPRESSION(S) / ED DIAGNOSES  Final diagnoses:  Arm contusion, left, initial encounter  Contusion of right hip, initial encounter     ED Discharge Orders    None       Note: This dictation was prepared with Dragon dictation along with smaller phrase technology. Any transcriptional errors that result from this process are unintentional.         Renford Dills, NP 03/09/18 1518

## 2018-03-09 NOTE — ED Triage Notes (Signed)
Pt reports falling while going into the house on a small step and landed on her left forearm and then onto her right hip. Has a hip replacement in same hip. Pain 2/10 but worse with movement in left forearm and shoulder.

## 2018-03-09 NOTE — Discharge Instructions (Signed)
Stretch. Ice. Rest. Drink plenty of fluids.   Follow up with your primary care physician this week as needed. Return to Urgent care for new or worsening concerns.

## 2018-03-18 ENCOUNTER — Ambulatory Visit: Payer: Self-pay | Admitting: Podiatry

## 2018-04-04 ENCOUNTER — Ambulatory Visit: Payer: Self-pay | Admitting: Podiatry

## 2018-04-08 ENCOUNTER — Ambulatory Visit (INDEPENDENT_AMBULATORY_CARE_PROVIDER_SITE_OTHER): Payer: BLUE CROSS/BLUE SHIELD

## 2018-04-08 ENCOUNTER — Other Ambulatory Visit: Payer: Self-pay | Admitting: Podiatry

## 2018-04-08 ENCOUNTER — Encounter: Payer: Self-pay | Admitting: Podiatry

## 2018-04-08 ENCOUNTER — Ambulatory Visit: Payer: BLUE CROSS/BLUE SHIELD | Admitting: Podiatry

## 2018-04-08 VITALS — BP 131/79 | HR 87

## 2018-04-08 DIAGNOSIS — G5761 Lesion of plantar nerve, right lower limb: Secondary | ICD-10-CM

## 2018-04-08 DIAGNOSIS — M7752 Other enthesopathy of left foot: Secondary | ICD-10-CM

## 2018-04-08 DIAGNOSIS — M79671 Pain in right foot: Secondary | ICD-10-CM

## 2018-04-08 DIAGNOSIS — M779 Enthesopathy, unspecified: Secondary | ICD-10-CM

## 2018-04-08 DIAGNOSIS — M79672 Pain in left foot: Secondary | ICD-10-CM

## 2018-04-08 MED ORDER — NONFORMULARY OR COMPOUNDED ITEM: 2 refills | Status: AC

## 2018-04-17 NOTE — Progress Notes (Signed)
   HPI: 47 year old female presenting today as a new patient with a chief complaint of bilateral foot pain that has been ongoing for the past 6-8 weeks. She states the pain is located on the ball of the right foot and states her 2nd and 3rd toes are separating. She also notes pain to the lateral side of the left foot into the ankle and the left arch. Standing for long periods of time increases the pain. She has not done anything for treatment. Patient is here for further evaluation and treatment.   Past Medical History:  Diagnosis Date  . Colitis, ischemic (HCC)   . Fibromyalgia   . GERD (gastroesophageal reflux disease)   . Vitamin D deficiency       Physical Exam: General: The patient is alert and oriented x3 in no acute distress.  Dermatology: Skin is warm, dry and supple bilateral lower extremities. Negative for open lesions or macerations.  Vascular: Palpable pedal pulses bilaterally. No edema or erythema noted. Capillary refill within normal limits.  Neurological: Epicritic and protective threshold grossly intact bilaterally.   Musculoskeletal Exam: Sharp pain with palpation of the 2nd interspace and lateral compression of the metatarsal heads consistent with neuroma.  Positive Lendell CapriceSullivan sign with loadbearing of the forefoot. Pain with palpation of the left midfoot.   Radiographic Exam:  Normal osseous mineralization. Joint spaces preserved. No fracture/dislocation/boney destruction.    Assessment: 1. 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 Capsulitis left midfoot / possible extensor tendinitis  3. Possible tendon rupture left dorsal foot   Plan of Care:  1. Patient was evaluated. X-Rays reviewed.  2. Injection of 0.5 mLs Celestone Soluspan injected into the neuroma of the right foot. Offloading pads dispensed.  3. Order for MRI of the left foot placed.  4. Cannot take oral NSAIDs.  5. Prescription for pain cream for the left foot to be dispensed by [facility].   6. Return to clinic in 4 weeks.    Felecia ShellingBrent M. Jahnae Mcadoo, DPM [facility]  Dr. Felecia ShellingBrent M. Yuridiana Formanek, DPM    [address]                                        [place], [place] [id number]                Office [phone]  Fax [phone]

## 2018-04-28 ENCOUNTER — Encounter: Payer: Self-pay | Admitting: Emergency Medicine

## 2018-04-28 ENCOUNTER — Other Ambulatory Visit: Payer: Self-pay

## 2018-04-28 ENCOUNTER — Ambulatory Visit (INDEPENDENT_AMBULATORY_CARE_PROVIDER_SITE_OTHER): Payer: BLUE CROSS/BLUE SHIELD

## 2018-04-28 ENCOUNTER — Ambulatory Visit
Admission: EM | Admit: 2018-04-28 | Discharge: 2018-04-28 | Disposition: A | Discharge status: Home / Self Care | Payer: BLUE CROSS/BLUE SHIELD | Attending: Family Medicine | Admitting: Family Medicine

## 2018-04-28 DIAGNOSIS — S7001XA Contusion of right hip, initial encounter: Secondary | ICD-10-CM

## 2018-04-28 DIAGNOSIS — W19XXXA Unspecified fall, initial encounter: Secondary | ICD-10-CM

## 2018-04-28 DIAGNOSIS — S5011XA Contusion of right forearm, initial encounter: Secondary | ICD-10-CM | POA: Diagnosis not present

## 2018-04-28 NOTE — ED Provider Notes (Signed)
[facility]    CSN: [id number] Arrival date & time: 04/28/18  1216     History   Chief Complaint Chief Complaint  Patient presents with  . Fall    DOI 04/26/18    HPI [name] is a 47 y.o. female.   47 yo female with a c/o right forearm pain and right hip pain after falling 2 days ago. States she tripped and fell landing on her right forearm and hip. States she has a h/o right hip replacement years ago. Denies any numbness/tingling.   The history is provided by the patient.  Fall     Past Medical History:  Diagnosis Date  . Colitis, ischemic (HCC)   . Fibromyalgia   . GERD (gastroesophageal reflux disease)   . Vitamin D deficiency     Patient Active Problem List   Diagnosis Date Noted  . Bilateral shoulder pain 07/18/2017  . Elevated erythrocyte sedimentation rate 07/18/2017  . Polyarthralgia 07/18/2017  . Left foot pain 03/06/2017  . Peroneal tendon tear, left, sequela 03/06/2017  . 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 Pathological fracture, hip, unsp, init encntr for fracture (HCC) 11/01/2016  . Fibromyalgia 11/01/2016  . Vitamin D deficiency 09/03/2013  . Insomnia 08/11/2013  . Former tobacco use 06/01/2013  . GERD (gastroesophageal reflux disease) 06/01/2013  . Ischemic colitis (HCC) 02/22/2013    Past Surgical History:  Procedure Laterality Date  . ABDOMINAL HYSTERECTOMY    . CARPAL TUNNEL RELEASE Bilateral   . HIP ARTHROPLASTY Right 11/02/2016   Procedure: ARTHROPLASTY BIPOLAR HIP (HEMIARTHROPLASTY);  Surgeon: Donato Heinz, MD;  Location: [facility] ORS;  Service: Orthopedics;  Laterality: Right;  . Left thumb CMC interpositional arthroplasty    . TRIGGER FINGER RELEASE      OB History   None      Home Medications    Prior to Admission medications   Medication Sig Start Date End Date Taking? Authorizing Provider  calcium-vitamin D (OSCAL WITH D) 500-200 MG-UNIT tablet Take 2 tablets by mouth daily. 11/06/16  Yes Shaune Pollack, MD    Cholecalciferol (VITAMIN D3) 1000 units CAPS Take by mouth.   Yes [provider]  citalopram (CELEXA) 40 MG tablet Take 40 mg by mouth daily.   Yes [provider]  gabapentin (NEURONTIN) 300 MG capsule Take 300 mg by mouth 3 (three) times daily. 12/09/17  Yes [provider]  halobetasol (ULTRAVATE) 0.05 % cream APPLY TO AFFECTED AREA TWICE A DAY 12/24/17  Yes [provider]  omeprazole (PRILOSEC) 40 MG capsule Take by mouth daily. 03/21/18  Yes [provider]  SUBOXONE 8-2 MG FILM Place 1 Film under the tongue 2 (two) times daily. 10/31/16  Yes [provider]  diazepam (VALIUM) 5 MG tablet Take by mouth. 01/20/13   [provider]  enoxaparin (LOVENOX) 40 MG/0.4ML injection Inject 0.4 mLs (40 mg total) into the skin daily. 11/04/16 11/18/16  Evon Slack, PA-C  famotidine (PEPCID) 40 MG tablet Take by mouth.    [provider]  fluticasone Aleda Grana) 50 MCG/ACT nasal spray  09/08/13   [provider]  hyoscyamine (LEVSIN SL) 0.125 MG SL tablet Place under the tongue.    [provider]  ibuprofen (ADVIL,MOTRIN) 600 MG tablet Take by mouth. 01/20/13   [provider]  methocarbamol (ROBAXIN) 750 MG tablet Take by mouth. 12/23/15   [provider]  NONFORMULARY OR COMPOUNDED ITEM See pharmacy note 04/08/18   Felecia Shelling, DPM  omeprazole (  PRILOSEC) 20 MG capsule Take 20 mg by mouth daily.    [provider]  oxyCODONE (OXY IR/ROXICODONE) 5 MG immediate release tablet  11/13/13   [provider]  traMADol Janean Sark) 50 MG tablet  11/07/13   [provider]    Family History Family History  Problem Relation Age of Onset  . CAD Mother   . CAD Father   . Colon cancer Maternal Grandfather     Social History Social History   Tobacco Use  . Smoking status: Former Smoker    Packs/day: 1.00    Years: 25.00    Pack years: 25.00    Last attempt to quit: 09/23/2013     Years since quitting: 4.5  . Smokeless tobacco: Never Used  Substance Use Topics  . Alcohol use: No  . Drug use: No     Allergies   Sulfa antibiotics   Review of Systems Review of Systems   Physical Exam Triage Vital Signs ED Triage Vitals [04/28/18 1246]  Enc Vitals Group     BP (!) 168/108     Pulse Rate 95     Resp 16     Temp 98.3 F (36.8 C)     Temp Source Oral     SpO2 100 %     Weight 152 lb (68.9 kg)     Height 5\' 1"  (1.549 m)     Head Circumference      Peak Flow      Pain Score 4     Pain Loc      Pain Edu?      Excl. in GC?    No data found.  Updated Vital Signs BP (!) 168/108 (BP Location: Left Arm)   Pulse 95   Temp 98.3 F (36.8 C) (Oral)   Resp 16   Ht 5\' 1"  (1.549 m)   Wt 152 lb (68.9 kg)   SpO2 100%   BMI 28.72 kg/m   Visual Acuity Right Eye Distance:   Left Eye Distance:   Bilateral Distance:    Right Eye Near:   Left Eye Near:    Bilateral Near:     Physical Exam  Constitutional: She appears well-developed and well-nourished. No distress.  Musculoskeletal:       Right hip: She exhibits tenderness and bony tenderness. She exhibits normal range of motion, normal strength, no swelling, no crepitus, no deformity and no laceration.       Right forearm: She exhibits tenderness, bony tenderness and swelling. She exhibits no deformity and no laceration.  Skin: She is not diaphoretic.  Nursing note and vitals reviewed.    UC Treatments / Results  Labs (all labs ordered are listed, but only abnormal results are displayed) Labs Reviewed - No data to display  EKG None  Radiology Dg Forearm Right  Result Date: 04/28/2018 CLINICAL DATA:  Acute RIGHT forearm pain following fall yesterday. Initial encounter. EXAM: RIGHT FOREARM - 2 VIEW COMPARISON:  None. FINDINGS: No acute fracture, subluxation or dislocation. Degenerative changes within the LATERAL wrist again noted. No suspicious focal bony lesions identified. IMPRESSION: No  acute abnormality. Electronically Signed   By: Harmon Pier M.D.   On: 04/28/2018 13:48   Dg Hip Unilat With Pelvis 2-3 Views Right  Result Date: 04/28/2018 CLINICAL DATA:  Acute RIGHT hip pain following fall yesterday. Initial encounter. EXAM: DG HIP (WITH OR WITHOUT PELVIS) 2-3V RIGHT COMPARISON:  03/09/2018 and prior radiographs FINDINGS: RIGHT hip arthroplasty changes again noted. There is  no evidence of acute fracture, subluxation or dislocation. Mild lucency along the distal aspect of the femoral stem is noted. No focal bony lesions are present. IMPRESSION: 1. No evidence of acute abnormality 2. Chronic mild lucency along the distal femoral stem which could represent loosening. Electronically Signed   By: Harmon PierJeffrey  Hu M.D.   On: 04/28/2018 13:51    Procedures Procedures (including critical care time)  Medications Ordered in UC Medications - No data to display  Initial Impression / Assessment and Plan / UC Course  I have reviewed the triage vital signs and the nursing notes.  Pertinent labs & imaging results that were available during my care of the patient were reviewed by me and considered in my medical decision making (see chart for details).      Final Clinical Impressions(s) / UC Diagnoses   Final diagnoses:  Fall  Contusion of right forearm, initial encounter  Contusion of right hip, initial encounter    ED Prescriptions    None     1. x-ray results and diagnosis reviewed with patient 2. rx as per orders above; reviewed possible side effects, interactions, risks and benefits  3. Recommend supportive treatment with rest, ice, otc analgesics prn 4. Follow-up prn if symptoms worsen or don't improve    Controlled Substance Prescriptions [place] Controlled Substance Registry consulted? Not Applicable   Payton Mccallumonty, Joslynn Jamroz, MD 04/28/18 [phone]

## 2018-04-28 NOTE — Discharge Instructions (Signed)
Rest , ice, tylenol

## 2018-04-28 NOTE — ED Triage Notes (Addendum)
Patient in today c/o fall on Saturday (04/26/18). Patient states she either tripped or drug her right toe and fell. Patient c/o right forearm and right hip pain.

## 2018-04-29 ENCOUNTER — Other Ambulatory Visit: Payer: Self-pay

## 2018-04-29 ENCOUNTER — Telehealth: Payer: Self-pay

## 2018-04-29 DIAGNOSIS — M7752 Other enthesopathy of left foot: Secondary | ICD-10-CM

## 2018-04-29 NOTE — Telephone Encounter (Signed)
-----   Message from Felecia ShellingBrent M Evans, DPM sent at 04/08/2018  3:29 PM EDT ----- Regarding: MRI left foot Please order MRI left foot w/ or w/out contrast.   Dx: chronic extensor tendinitis LT foot  Dr. Logan BoresEvans

## 2018-04-29 NOTE — Telephone Encounter (Signed)
MRI has been approved from 04/29/18 to 05/28/18 Auth # [id number]  Patient has been notified via voice mail to call scheduling and set up own appt.

## 2018-05-06 ENCOUNTER — Ambulatory Visit: Payer: BLUE CROSS/BLUE SHIELD | Admitting: Podiatry

## 2018-05-20 ENCOUNTER — Ambulatory Visit
Admission: RE | Admit: 2018-05-20 | Discharge: 2018-05-20 | Disposition: A | Discharge status: Home / Self Care | Payer: BLUE CROSS/BLUE SHIELD | Source: Ambulatory Visit | Attending: Podiatry | Admitting: Podiatry

## 2018-05-20 DIAGNOSIS — M7752 Other enthesopathy of left foot: Secondary | ICD-10-CM

## 2018-05-20 DIAGNOSIS — M779 Enthesopathy, unspecified: Secondary | ICD-10-CM | POA: Diagnosis present

## 2018-06-12 DIAGNOSIS — M79676 Pain in unspecified toe(s): Secondary | ICD-10-CM

## 2018-07-28 ENCOUNTER — Encounter: Payer: Self-pay | Admitting: Emergency Medicine

## 2018-07-28 ENCOUNTER — Ambulatory Visit (INDEPENDENT_AMBULATORY_CARE_PROVIDER_SITE_OTHER): Payer: BLUE CROSS/BLUE SHIELD

## 2018-07-28 ENCOUNTER — Other Ambulatory Visit: Payer: Self-pay

## 2018-07-28 ENCOUNTER — Ambulatory Visit
Admission: EM | Admit: 2018-07-28 | Discharge: 2018-07-28 | Disposition: A | Discharge status: Home / Self Care | Payer: BLUE CROSS/BLUE SHIELD | Attending: Family Medicine | Admitting: Family Medicine

## 2018-07-28 DIAGNOSIS — M25551 Pain in right hip: Secondary | ICD-10-CM | POA: Diagnosis not present

## 2018-07-28 DIAGNOSIS — M25561 Pain in right knee: Secondary | ICD-10-CM | POA: Diagnosis not present

## 2018-07-28 DIAGNOSIS — W19XXXA Unspecified fall, initial encounter: Secondary | ICD-10-CM

## 2018-07-28 DIAGNOSIS — W1831XA Fall on same level due to stepping on an object, initial encounter: Secondary | ICD-10-CM | POA: Diagnosis not present

## 2018-07-28 DIAGNOSIS — S8001XA Contusion of right knee, initial encounter: Secondary | ICD-10-CM

## 2018-07-28 DIAGNOSIS — M25511 Pain in right shoulder: Secondary | ICD-10-CM

## 2018-07-28 DIAGNOSIS — S43401A Unspecified sprain of right shoulder joint, initial encounter: Secondary | ICD-10-CM

## 2018-07-28 MED ORDER — CYCLOBENZAPRINE HCL 5 MG PO TABS: 10.0000 mg | ORAL_TABLET | Freq: Every day | ORAL | 0 refills | Status: AC

## 2018-07-28 NOTE — ED Triage Notes (Signed)
Patient in today c/o right knee, shoulder pain after falling on 07/25/18 while walking her dog. Patient has had a partial right hip replacement and is also concerned about that.

## 2018-07-28 NOTE — Discharge Instructions (Signed)
Rest, ice/heat, tylenol as needed 

## 2018-07-28 NOTE — ED Triage Notes (Signed)
Patient also having pain on the right side of her right hip.

## 2018-10-13 ENCOUNTER — Encounter: Payer: Self-pay | Admitting: Emergency Medicine

## 2018-10-13 ENCOUNTER — Other Ambulatory Visit: Payer: Self-pay

## 2018-10-13 ENCOUNTER — Ambulatory Visit
Admission: EM | Admit: 2018-10-13 | Discharge: 2018-10-13 | Disposition: A | Discharge status: Home / Self Care | Payer: BLUE CROSS/BLUE SHIELD | Attending: Family Medicine | Admitting: Family Medicine

## 2018-10-13 DIAGNOSIS — J0100 Acute maxillary sinusitis, unspecified: Secondary | ICD-10-CM | POA: Insufficient documentation

## 2018-10-13 DIAGNOSIS — Z87891 Personal history of nicotine dependence: Secondary | ICD-10-CM | POA: Diagnosis not present

## 2018-10-13 MED ORDER — AMOXICILLIN 875 MG PO TABS: 875.0000 mg | ORAL_TABLET | Freq: Two times a day (BID) | ORAL | 0 refills | Status: DC

## 2018-10-13 NOTE — ED Triage Notes (Signed)
Pt c/o headache, nausea, and body aches. Started 4 days ago. By the end of the day her pain goes into her neck.

## 2018-10-13 NOTE — ED Provider Notes (Signed)
[facility]    CSN: [id number] Arrival date & time: 10/13/18  1146     History   Chief Complaint Chief Complaint  Patient presents with  . Headache    appt  . Generalized Body Aches    HPI [name] is a 48 y.o. female.   The history is provided by the patient.  Headache  Associated symptoms: congestion, cough, facial pain, fatigue and URI   URI  Presenting symptoms: congestion, cough, facial pain and fatigue   Severity:  Moderate Onset quality:  Sudden Duration:  1 week Timing:  Constant Progression:  Worsening Chronicity:  New Relieved by:  Nothing Ineffective treatments:  OTC medications Associated symptoms: headaches and sinus pain   Risk factors: sick contacts     Past Medical History:  Diagnosis Date  . Colitis, ischemic (HCC)   . Fibromyalgia   . GERD (gastroesophageal reflux disease)   . Vitamin D deficiency     Patient Active Problem List   Diagnosis Date Noted  . Bilateral shoulder pain 07/18/2017  . Elevated erythrocyte sedimentation rate 07/18/2017  . Polyarthralgia 07/18/2017  . Left foot pain 03/06/2017  . Peroneal tendon tear, left, sequela 03/06/2017  . 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 Pathological fracture, hip, unsp, init encntr for fracture (HCC) 11/01/2016  . Fibromyalgia 11/01/2016  . Vitamin D deficiency 09/03/2013  . Insomnia 08/11/2013  . Former tobacco use 06/01/2013  . GERD (gastroesophageal reflux disease) 06/01/2013  . Ischemic colitis (HCC) 02/22/2013    Past Surgical History:  Procedure Laterality Date  . ABDOMINAL HYSTERECTOMY    . CARPAL TUNNEL RELEASE Bilateral   . HIP ARTHROPLASTY Right 11/02/2016   Procedure: ARTHROPLASTY BIPOLAR HIP (HEMIARTHROPLASTY);  Surgeon: Donato Heinz, MD;  Location: [facility] ORS;  Service: Orthopedics;  Laterality: Right;  . Left thumb CMC interpositional arthroplasty    . TRIGGER FINGER RELEASE      OB History   No obstetric history on file.      Home  Medications    Prior to Admission medications   Medication Sig Start Date End Date Taking? Authorizing Provider  calcium-vitamin D (OSCAL WITH D) 500-200 MG-UNIT tablet Take 2 tablets by mouth daily. 11/06/16  Yes Shaune Pollack, MD  Cholecalciferol (VITAMIN D3) 1000 units CAPS Take by mouth.   Yes [provider]  citalopram (CELEXA) 40 MG tablet Take 40 mg by mouth daily.   Yes [provider]  cyclobenzaprine (FLEXERIL) 5 MG tablet Take 2 tablets (10 mg total) by mouth at bedtime. 07/28/18  Yes Payton Mccallum, MD  famotidine (PEPCID) 40 MG tablet Take by mouth.   Yes [provider]  omeprazole (PRILOSEC) 40 MG capsule Take by mouth daily. 03/21/18  Yes [provider]  SUBOXONE 8-2 MG FILM Place 1 Film under the tongue 2 (two) times daily. 10/31/16  Yes [provider]  amoxicillin (AMOXIL) 875 MG tablet Take 1 tablet (875 mg total) by mouth 2 (two) times daily. 10/13/18   Payton Mccallum, MD  diazepam (VALIUM) 5 MG tablet Take by mouth. 01/20/13   [provider]  enoxaparin (LOVENOX) 40 MG/0.4ML injection Inject 0.4 mLs (40 mg total) into the skin daily. 11/04/16 11/18/16  Evon Slack, PA-C  fluticasone Aleda Grana) 50 MCG/ACT nasal spray  09/08/13   [provider]  gabapentin (NEURONTIN) 300 MG capsule Take 300 mg by mouth 3 (three) times daily. 12/09/17   [provider]  halobetasol (ULTRAVATE) 0.05 % cream APPLY TO AFFECTED AREA TWICE  A DAY 12/24/17   [provider]  hyoscyamine (LEVSIN SL) 0.125 MG SL tablet Place under the tongue.    [provider]  ibuprofen (ADVIL,MOTRIN) 600 MG tablet Take by mouth. 01/20/13   [provider]  methocarbamol (ROBAXIN) 750 MG tablet Take by mouth. 12/23/15   [provider]  NONFORMULARY OR COMPOUNDED ITEM See pharmacy note 04/08/18   Felecia ShellingEvans, Brent M, DPM  omeprazole (PRILOSEC) 20 MG capsule Take 20 mg by mouth daily.    [provider]  oxyCODONE  (OXY IR/ROXICODONE) 5 MG immediate release tablet  11/13/13   [provider]  traMADol Janean Sark(ULTRAM) 50 MG tablet  11/07/13   [provider]    Family History Family History  Problem Relation Age of Onset  . CAD Mother   . CAD Father   . Colon cancer Maternal Grandfather     Social History Social History   Tobacco Use  . Smoking status: Former Smoker    Packs/day: 1.00    Years: 25.00    Pack years: 25.00    Last attempt to quit: 09/23/2013    Years since quitting: 5.0  . Smokeless tobacco: Never Used  Substance Use Topics  . Alcohol use: No  . Drug use: No     Allergies   Sulfa antibiotics   Review of Systems Review of Systems  Constitutional: Positive for fatigue.  HENT: Positive for congestion and sinus pain.   Respiratory: Positive for cough.   Neurological: Positive for headaches.     Physical Exam Triage Vital Signs ED Triage Vitals  Enc Vitals Group     BP 10/13/18 1206 (!) 167/102     Pulse Rate 10/13/18 1206 93     Resp 10/13/18 1206 18     Temp 10/13/18 1206 98.1 F (36.7 C)     Temp Source 10/13/18 1206 Oral     SpO2 10/13/18 1206 100 %     Weight 10/13/18 1203 150 lb (68 kg)     Height 10/13/18 1203 5' (1.524 m)     Head Circumference --      Peak Flow --      Pain Score 10/13/18 1203 6     Pain Loc --      Pain Edu? --      Excl. in GC? --    No data found.  Updated Vital Signs BP (!) 167/102 (BP Location: Left Arm)   Pulse 93   Temp 98.1 F (36.7 C) (Oral)   Resp 18   Ht 5' (1.524 m)   Wt 68 kg   SpO2 100%   BMI 29.29 kg/m   Visual Acuity Right Eye Distance:   Left Eye Distance:   Bilateral Distance:    Right Eye Near:   Left Eye Near:    Bilateral Near:     Physical Exam Vitals signs and nursing note reviewed.  Constitutional:      General: She is not in acute distress.    Appearance: She is well-developed. She is not diaphoretic.  HENT:     Head: Normocephalic and atraumatic.     Right Ear:  Tympanic membrane, ear canal and external ear normal.     Left Ear: Tympanic membrane, ear canal and external ear normal.     Nose: Rhinorrhea present.     Right Sinus: Maxillary sinus tenderness and frontal sinus tenderness present.     Left Sinus: Maxillary sinus tenderness and frontal sinus tenderness present.  Mouth/Throat:     Pharynx: Uvula midline. No oropharyngeal exudate.  Eyes:     General: No scleral icterus.       Right eye: No discharge.        Left eye: No discharge.  Neck:     Musculoskeletal: Normal range of motion and neck supple.     Thyroid: No thyromegaly.  Cardiovascular:     Rate and Rhythm: Normal rate and regular rhythm.     Heart sounds: Normal heart sounds.  Pulmonary:     Effort: Pulmonary effort is normal. No respiratory distress.     Breath sounds: Normal breath sounds. No stridor. No wheezing, rhonchi or rales.  Lymphadenopathy:     Cervical: No cervical adenopathy.      UC Treatments / Results  Labs (all labs ordered are listed, but only abnormal results are displayed) Labs Reviewed - No data to display  EKG None  Radiology No results found.  Procedures Procedures (including critical care time)  Medications Ordered in UC Medications - No data to display  Initial Impression / Assessment and Plan / UC Course  I have reviewed the triage vital signs and the nursing notes.  Pertinent labs & imaging results that were available during my care of the patient were reviewed by me and considered in my medical decision making (see chart for details).      Final Clinical Impressions(s) / UC Diagnoses   Final diagnoses:  Acute maxillary sinusitis, recurrence not specified    ED Prescriptions    Medication Sig Dispense Auth. Provider   amoxicillin (AMOXIL) 875 MG tablet Take 1 tablet (875 mg total) by mouth 2 (two) times daily. 20 tablet Payton Mccallum, MD      1. diagnosis reviewed with patient 2. rx as per orders above; reviewed  possible side effects, interactions, risks and benefits  3. Recommend supportive treatment with otc flonase 4. Follow-up prn if symptoms worsen or don't improve   Controlled Substance Prescriptions [place] Controlled Substance Registry consulted? Not Applicable   Payton Mccallum, MD 10/13/18 [phone]

## 2018-11-27 ENCOUNTER — Other Ambulatory Visit: Payer: Self-pay | Admitting: Family

## 2018-11-27 DIAGNOSIS — Z1231 Encounter for screening mammogram for malignant neoplasm of breast: Secondary | ICD-10-CM

## 2019-03-21 ENCOUNTER — Encounter: Payer: Self-pay | Admitting: Emergency Medicine

## 2019-03-21 ENCOUNTER — Other Ambulatory Visit: Payer: Self-pay

## 2019-03-21 ENCOUNTER — Ambulatory Visit
Admission: EM | Admit: 2019-03-21 | Discharge: 2019-03-21 | Disposition: A | Discharge status: Home / Self Care | Payer: BLUE CROSS/BLUE SHIELD | Attending: Family Medicine | Admitting: Family Medicine

## 2019-03-21 ENCOUNTER — Ambulatory Visit (INDEPENDENT_AMBULATORY_CARE_PROVIDER_SITE_OTHER): Payer: BLUE CROSS/BLUE SHIELD

## 2019-03-21 DIAGNOSIS — S0990XA Unspecified injury of head, initial encounter: Secondary | ICD-10-CM

## 2019-03-21 DIAGNOSIS — W01198A Fall on same level from slipping, tripping and stumbling with subsequent striking against other object, initial encounter: Secondary | ICD-10-CM

## 2019-03-21 NOTE — ED Triage Notes (Signed)
Patient states that she tripped over a dog at her work and fell and hit her head and right eye on a concrete block while at work.  Patient reports pain in her right eye.

## 2019-03-21 NOTE — Discharge Instructions (Signed)
Xray negative.  Ice.  OTC tylenol/ibuprofen.  Take care  Dr. Lacinda Axon

## 2019-03-21 NOTE — ED Provider Notes (Signed)
[facility]    CSN: [id number] Arrival date & time: 03/21/19  1210  History   Chief Complaint Chief Complaint  Patient presents with  . Head Injury  . Fall   HPI  48 year old female presents with the above complaints.  Patient reports that she fell at work earlier this week.  She works at a [employer].  She tripped over her dog and fell hitting her head (at the right eye) on a concrete block.  Patient reports that she is having mild pain, swelling, and bruising around the right eye.  No vision changes.  No reports of neck pain.  No dizziness, nausea, vomiting.  No reports of weakness or neurological symptoms.  No other reported symptoms.  Patient thought it would be best if she was examined.  No other complaints.  PMH, Surgical Hx, Family Hx, Social History reviewed and updated as below.  Past Medical History:  Diagnosis Date  . Colitis, ischemic (HCC)   . Fibromyalgia   . GERD (gastroesophageal reflux disease)   . Vitamin D deficiency    Patient Active Problem List   Diagnosis Date Noted  . Bilateral shoulder pain 07/18/2017  . Elevated erythrocyte sedimentation rate 07/18/2017  . Polyarthralgia 07/18/2017  . Left foot pain 03/06/2017  . Peroneal tendon tear, left, sequela 03/06/2017  . 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 Pathological fracture, hip, unsp, init encntr for fracture (HCC) 11/01/2016  . Fibromyalgia 11/01/2016  . Vitamin D deficiency 09/03/2013  . Insomnia 08/11/2013  . Former tobacco use 06/01/2013  . GERD (gastroesophageal reflux disease) 06/01/2013  . Ischemic colitis (HCC) 02/22/2013   Past Surgical History:  Procedure Laterality Date  . ABDOMINAL HYSTERECTOMY    . CARPAL TUNNEL RELEASE Bilateral   . HIP ARTHROPLASTY Right 11/02/2016   Procedure: ARTHROPLASTY BIPOLAR HIP (HEMIARTHROPLASTY);  Surgeon: Donato HeinzJames P Hooten, MD;  Location: [facility] ORS;  Service: Orthopedics;  Laterality: Right;  . Left thumb [facility] interpositional arthroplasty     . TRIGGER FINGER RELEASE     OB History   No obstetric history on file.    Home Medications    Prior to Admission medications   Medication Sig Start Date End Date Taking? Authorizing Provider  calcium-vitamin D (OSCAL WITH D) 500-200 MG-UNIT tablet Take 2 tablets by mouth daily. 11/06/16  Yes Shaune Pollackhen, Qing, MD  Cholecalciferol (VITAMIN D3) 1000 units CAPS Take by mouth.   Yes [provider]  citalopram (CELEXA) 40 MG tablet Take 40 mg by mouth daily.   Yes [provider]  famotidine (PEPCID) 40 MG tablet Take by mouth.   Yes [provider]  gabapentin (NEURONTIN) 300 MG capsule Take 300 mg by mouth 3 (three) times daily. 12/09/17  Yes [provider]  omeprazole (PRILOSEC) 40 MG capsule Take by mouth daily. 03/21/18  Yes [provider]  SUBOXONE 8-2 MG FILM Place 1 Film under the tongue 2 (two) times daily. 10/31/16  Yes [provider]  cyclobenzaprine (FLEXERIL) 5 MG tablet Take 2 tablets (10 mg total) by mouth at bedtime. 07/28/18   Payton Mccallumonty, [place], MD  halobetasol (ULTRAVATE) 0.05 % cream APPLY TO AFFECTED AREA TWICE A DAY 12/24/17   [provider]  hyoscyamine (LEVSIN SL) 0.125 MG SL tablet Place under the tongue.    [provider]  ibuprofen (ADVIL,MOTRIN) 600 MG tablet Take by mouth. 01/20/13   [provider]  NONFORMULARY OR COMPOUNDED ITEM See pharmacy note 04/08/18   Felecia ShellingEvans, Brent M, DPM  enoxaparin (  LOVENOX) 40 MG/0.4ML injection Inject 0.4 mLs (40 mg total) into the skin daily. 11/04/16 03/21/19  Evon SlackGaines, Thomas C, PA-C  fluticasone Aleda Grana(FLONASE) 50 MCG/ACT nasal spray  09/08/13 03/21/19  [provider]   Family History Family History  Problem Relation Age of Onset  . CAD Mother   . CAD Father   . Colon cancer Maternal Grandfather    Social History Social History   Tobacco Use  . Smoking status: Former Smoker    Packs/day: 1.00    Years: 25.00    Pack years: 25.00    Quit date: 09/23/2013     Years since quitting: 5.4  . Smokeless tobacco: Never Used  Substance Use Topics  . Alcohol use: No  . Drug use: No   Allergies   Sulfa antibiotics   Review of Systems Review of Systems  Constitutional: Negative.   Eyes: Negative for visual disturbance.  Neurological: Positive for headaches.   Physical Exam Triage Vital Signs ED Triage Vitals  Enc Vitals Group     BP 03/21/19 1228 (!) 139/100     Pulse Rate 03/21/19 1228 100     Resp 03/21/19 1228 16     Temp 03/21/19 1228 98.6 F (37 C)     Temp Source 03/21/19 1228 Oral     SpO2 03/21/19 1228 100 %     Weight 03/21/19 1224 148 lb (67.1 kg)     Height 03/21/19 1224 5' (1.524 m)     Head Circumference --      Peak Flow --      Pain Score 03/21/19 1224 3     Pain Loc --      Pain Edu? --      Excl. in GC? --     Updated Vital Signs BP (!) 139/100 (BP Location: Left Arm)   Pulse 100   Temp 98.6 F (37 C) (Oral)   Resp 16   Ht 5' (1.524 m)   Wt 67.1 kg   SpO2 100%   BMI 28.90 kg/m   Visual Acuity Right Eye Distance: 20/20 uncorrected Left Eye Distance: 20/20 uncorrected Bilateral Distance: 20/15 uncorrected  Right Eye Near:   Left Eye Near:    Bilateral Near:     Physical Exam Vitals signs and nursing note reviewed.  Constitutional:      General: She is not in acute distress.    Appearance: Normal appearance.  HENT:     Head:      Comments: Patient with a discrete area of swelling and tenderness to palpation at the level location.  Mild bruising. Eyes:     Extraocular Movements: Extraocular movements intact.     Conjunctiva/sclera: Conjunctivae normal.     Pupils: Pupils are equal, round, and reactive to light.  Cardiovascular:     Rate and Rhythm: Normal rate and regular rhythm.  Pulmonary:     Effort: Pulmonary effort is normal. No respiratory distress.  Neurological:     General: No focal deficit present.     Mental Status: She is alert and oriented to person, place, and time.   Psychiatric:        Mood and Affect: Mood normal.        Behavior: Behavior normal.    UC Treatments / Results  Labs (all labs ordered are listed, but only abnormal results are displayed) Labs Reviewed - No data to display  EKG None  Radiology Dg Orbits  Result Date: 03/21/2019 CLINICAL DATA:  [age] year old female with a history  of laceration above the right eye EXAM: ORBITS - COMPLETE 4+ VIEW COMPARISON:  None. FINDINGS: There is no evidence of fracture or other significant bone abnormality. No orbital emphysema or sinus air-fluid levels are seen. IMPRESSION: Negative. Electronically Signed   By: Corrie Mckusick D.O.   On: 03/21/2019 13:10    Procedures Procedures (including critical care time)  Medications Ordered in UC Medications - No data to display  Initial Impression / Assessment and Plan / UC Course  I have reviewed the triage vital signs and the nursing notes.  Pertinent labs & imaging results that were available during my care of the patient were reviewed by me and considered in my medical decision making (see chart for details).    48 year old female presents with a minor head injury.  Xray negative. Ice, tylenol/ibuprofen as needed.  Final Clinical Impressions(s) / UC Diagnoses   Final diagnoses:  Minor head injury, initial encounter     Discharge Instructions     Xray negative.  Ice.  OTC tylenol/ibuprofen.  Take care  Dr. Lacinda Axon     ED Prescriptions    None     Controlled Substance Prescriptions [place] Controlled Substance Registry consulted? Not Applicable   Coral Spikes, DO 03/21/19 1359

## 2019-07-22 ENCOUNTER — Other Ambulatory Visit: Payer: Self-pay

## 2019-07-22 DIAGNOSIS — Z20822 Contact with and (suspected) exposure to covid-19: Secondary | ICD-10-CM

## 2019-07-23 LAB — NOVEL CORONAVIRUS, NAA: SARS-CoV-2, NAA: NOT DETECTED

## 2019-10-27 ENCOUNTER — Ambulatory Visit: Payer: BLUE CROSS/BLUE SHIELD | Attending: Internal Medicine

## 2019-10-27 DIAGNOSIS — Z20822 Contact with and (suspected) exposure to covid-19: Secondary | ICD-10-CM

## 2019-10-28 LAB — NOVEL CORONAVIRUS, NAA: SARS-CoV-2, NAA: NOT DETECTED

## 2019-10-30 IMAGING — CR DG FOREARM 2V*L*
2 series · 2 of 2 positions shown · non-contrast
Comparison: None.

CLINICAL DATA: Fall on [REDACTED] evening, LEFT arm pain.

EXAM:
LEFT FOREARM - 2 VIEW

[forearm ap]
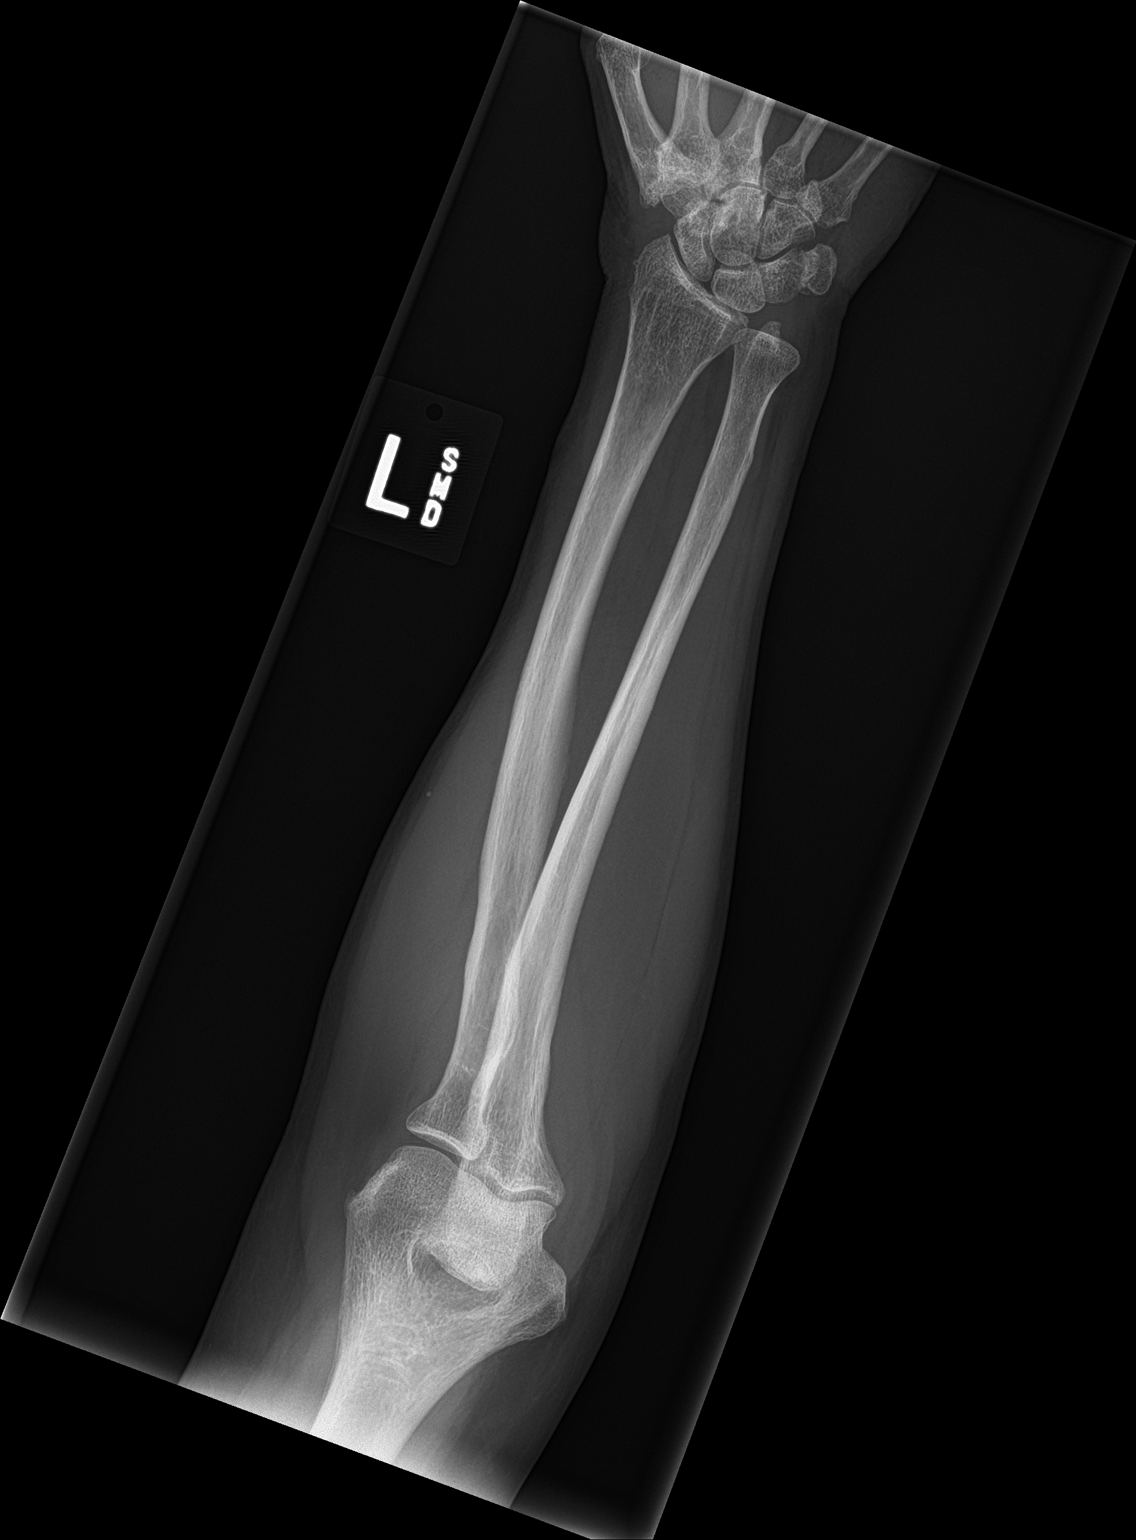

[forearm lat]
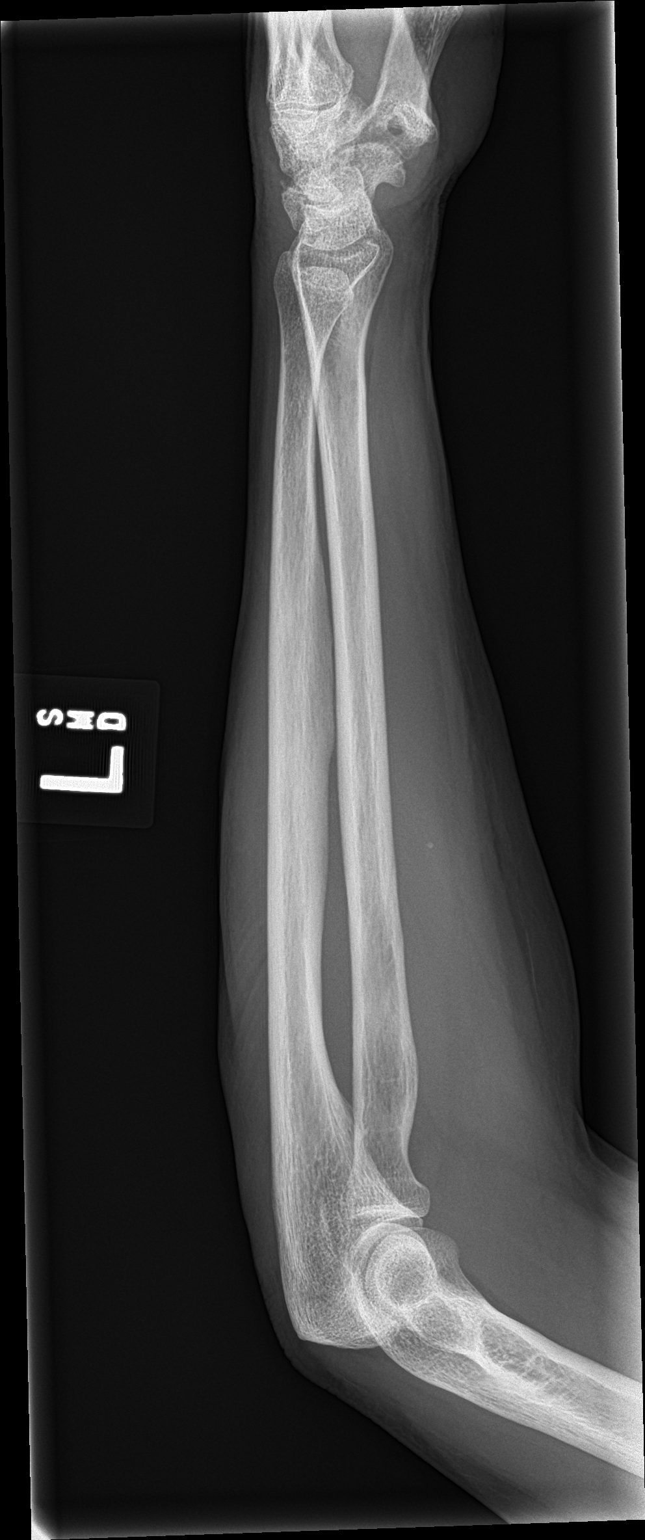

[2 of 2 positions shown; findings below may reference images not displayed]

FINDINGS: Radius and ulna are normally aligned. No fracture line or displaced
fracture fragment. Soft tissues about the forearm are unremarkable.
IMPRESSION: Negative.

## 2019-10-30 IMAGING — CR DG HUMERUS 2V *L*
2 series · 2 of 2 positions shown · non-contrast
Comparison: None.

CLINICAL DATA: Fall on [REDACTED], LEFT arm pain.

EXAM:
LEFT HUMERUS - 2+ VIEW

[humerus ap]
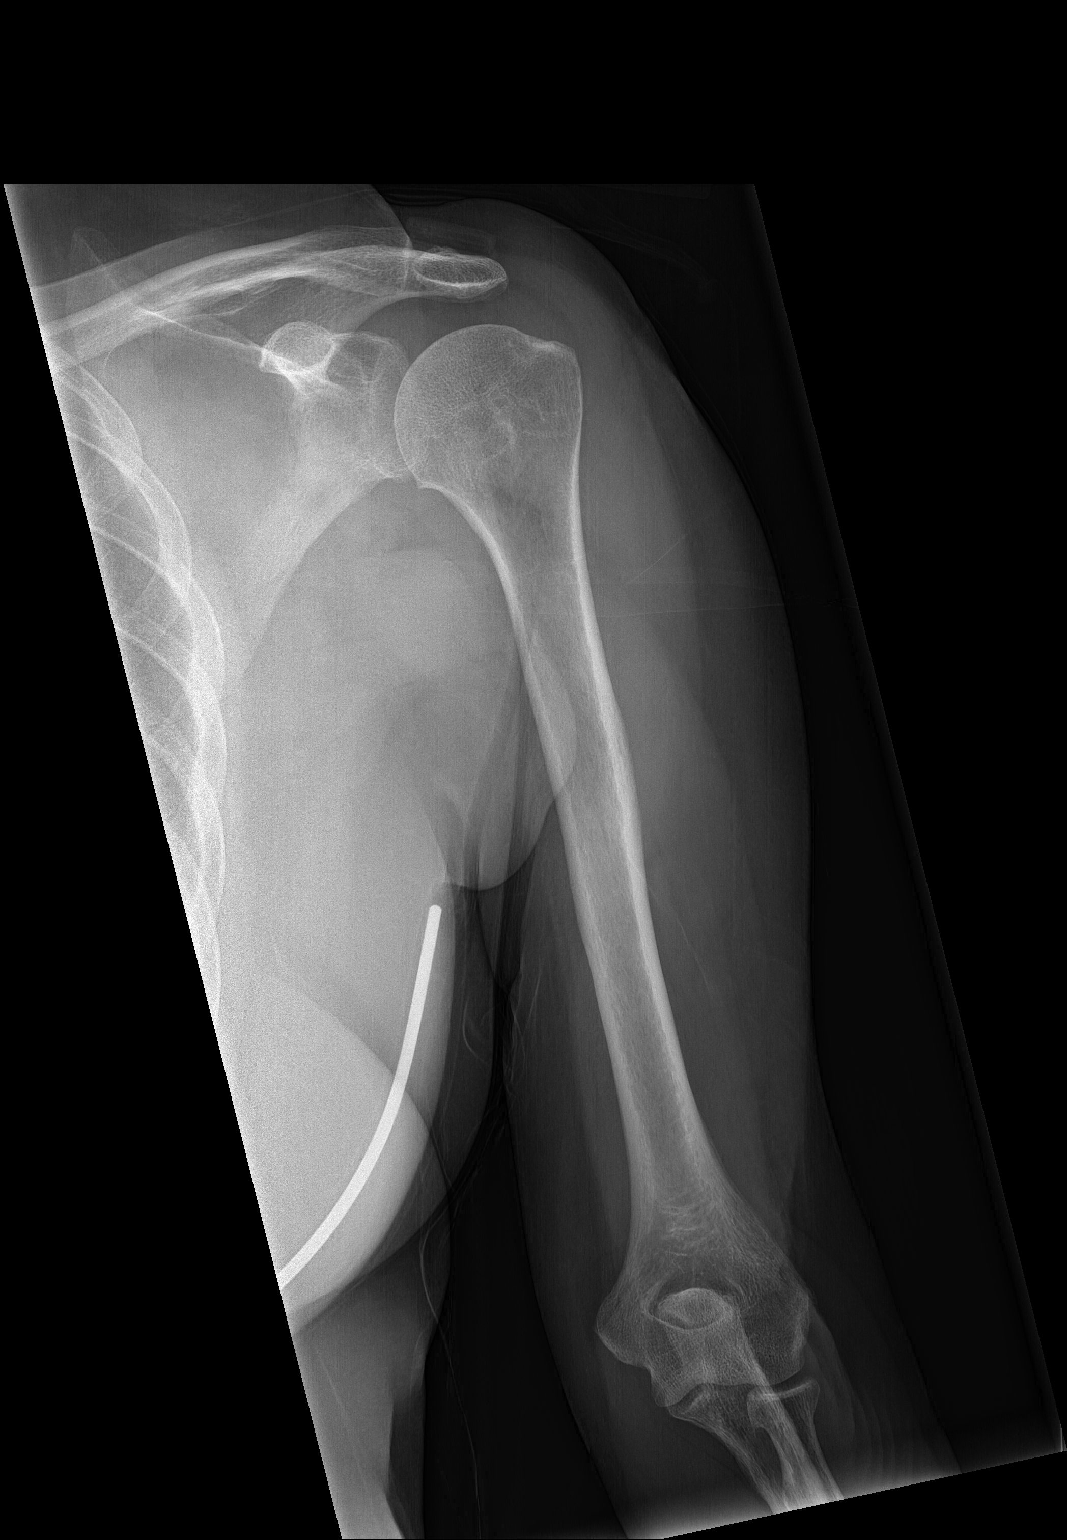

[humerus lat]
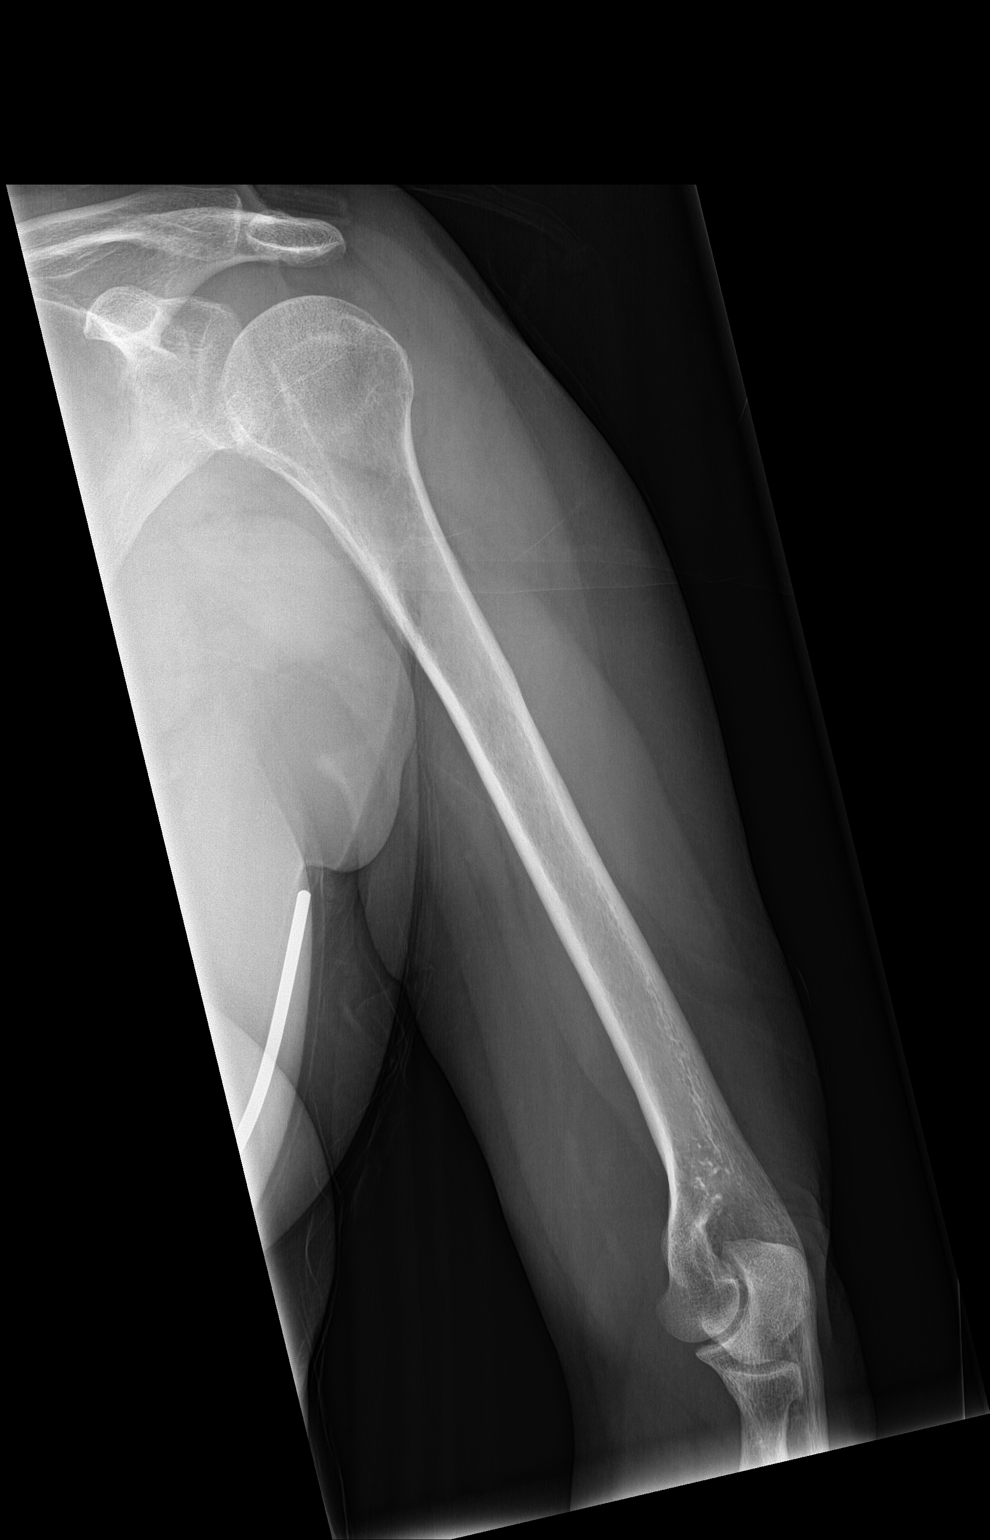

[2 of 2 positions shown; findings below may reference images not displayed]

FINDINGS: LEFT humerus is normally aligned. No fracture line or displaced
fracture fragment seen. No appreciable degenerative change at the
LEFT shoulder or LEFT elbow. Adjacent soft tissues are unremarkable.
IMPRESSION: Negative.

## 2019-11-04 ENCOUNTER — Ambulatory Visit: Payer: BLUE CROSS/BLUE SHIELD | Attending: Internal Medicine

## 2019-11-04 DIAGNOSIS — Z20822 Contact with and (suspected) exposure to covid-19: Secondary | ICD-10-CM

## 2019-11-05 ENCOUNTER — Encounter: Payer: Self-pay | Admitting: *Deleted

## 2019-11-05 LAB — NOVEL CORONAVIRUS, NAA: SARS-CoV-2, NAA: DETECTED — AB

## 2019-12-19 IMAGING — CR DG HIP (WITH OR WITHOUT PELVIS) 2-3V*R*
3 series · 4 of 4 positions shown · non-contrast
Comparison: 03/09/2018 and prior radiographs

CLINICAL DATA: Acute RIGHT hip pain following fall yesterday.
Initial encounter.

EXAM:
DG HIP (WITH OR WITHOUT PELVIS) 2-3V RIGHT

[pelvis ap]
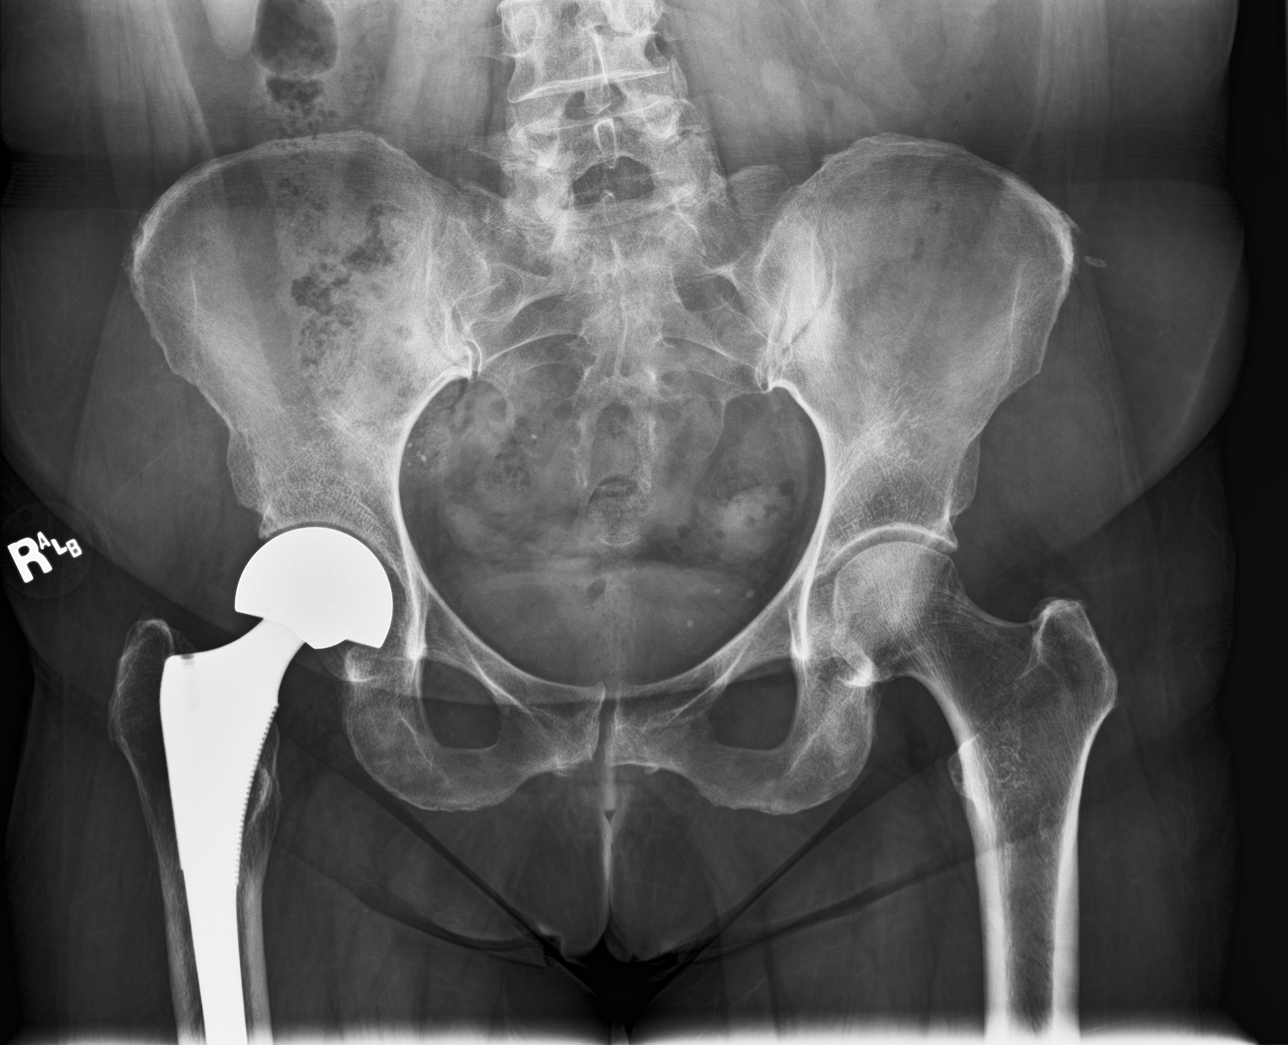

[Series 2: hip ap · 0.14mm/px · 2 of 2 slices shown]
[im 1/2]
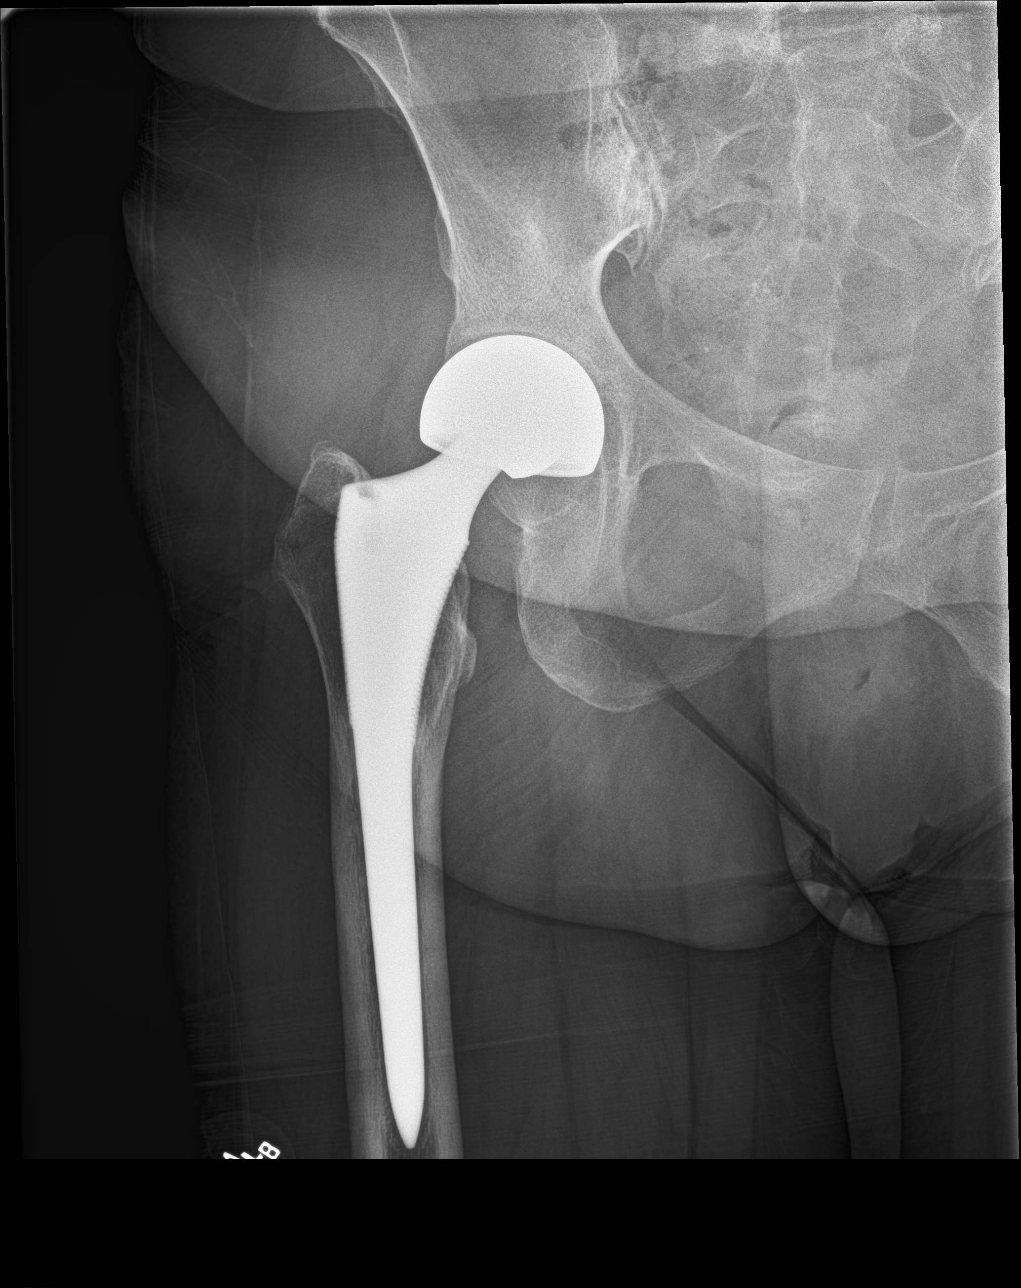
[im 2/2]
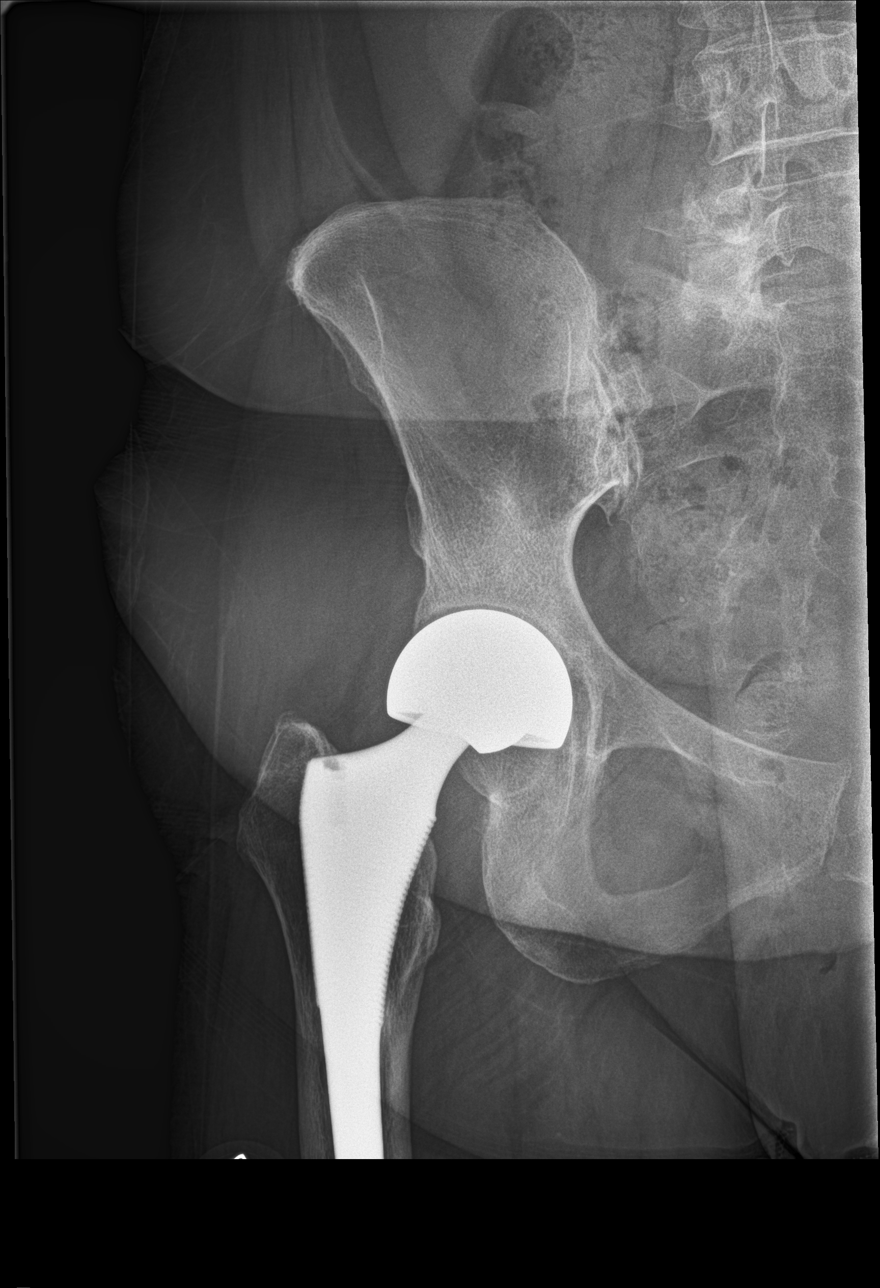

[hip lat]
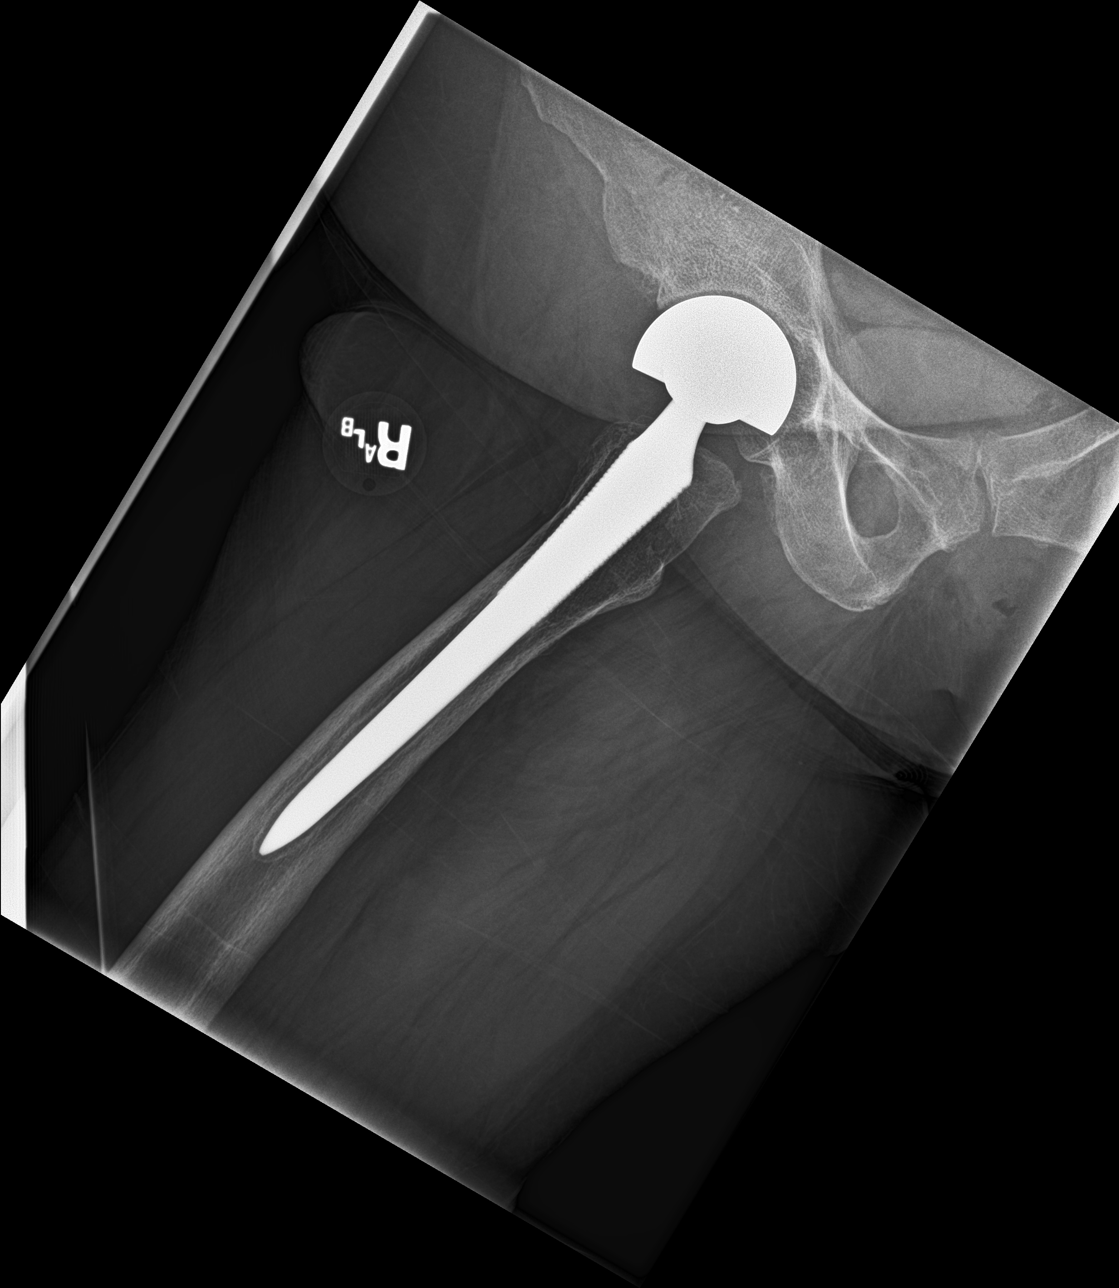

[4 of 4 positions shown; findings below may reference images not displayed]

FINDINGS: RIGHT hip arthroplasty changes again noted.

There is no evidence of acute fracture, subluxation or dislocation.

Mild lucency along the distal aspect of the femoral stem is noted.

No focal bony lesions are present.
IMPRESSION: 1. No evidence of acute abnormality
2. Chronic mild lucency along the distal femoral stem which could
represent loosening.

## 2020-06-22 ENCOUNTER — Other Ambulatory Visit: Payer: BLUE CROSS/BLUE SHIELD

## 2020-06-22 DIAGNOSIS — Z20822 Contact with and (suspected) exposure to covid-19: Secondary | ICD-10-CM

## 2020-06-23 LAB — NOVEL CORONAVIRUS, NAA: SARS-CoV-2, NAA: NOT DETECTED

## 2020-06-23 LAB — SARS-COV-2, NAA 2 DAY TAT

## 2020-10-08 ENCOUNTER — Other Ambulatory Visit: Payer: BLUE CROSS/BLUE SHIELD

## 2020-10-08 ENCOUNTER — Other Ambulatory Visit: Payer: Self-pay

## 2020-10-08 DIAGNOSIS — Z20822 Contact with and (suspected) exposure to covid-19: Secondary | ICD-10-CM

## 2020-10-11 LAB — NOVEL CORONAVIRUS, NAA: SARS-CoV-2, NAA: NOT DETECTED

## 2023-02-10 ENCOUNTER — Other Ambulatory Visit: Payer: Self-pay | Admitting: Internal Medicine

## 2023-02-21 ENCOUNTER — Other Ambulatory Visit: Payer: Self-pay

## 2023-02-21 MED ORDER — ATORVASTATIN CALCIUM 40 MG PO TABS: 40.0000 mg | ORAL_TABLET | Freq: Every day | ORAL | 0 refills | Status: AC
